# Patient Record
Sex: Female | Born: 1960 | Race: White | Hispanic: No | Smoking: Former smoker
Health system: Southern US, Community
[De-identification: ages and names within clinical notes are randomized; demographics above are authoritative.]

## PROBLEM LIST (undated history)

## (undated) DIAGNOSIS — K635 Polyp of colon: Secondary | ICD-10-CM

## (undated) DIAGNOSIS — E611 Iron deficiency: Secondary | ICD-10-CM

## (undated) DIAGNOSIS — F329 Major depressive disorder, single episode, unspecified: Secondary | ICD-10-CM

## (undated) DIAGNOSIS — R161 Splenomegaly, not elsewhere classified: Secondary | ICD-10-CM

## (undated) DIAGNOSIS — I639 Cerebral infarction, unspecified: Secondary | ICD-10-CM

## (undated) DIAGNOSIS — E119 Type 2 diabetes mellitus without complications: Secondary | ICD-10-CM

## (undated) DIAGNOSIS — F419 Anxiety disorder, unspecified: Secondary | ICD-10-CM

## (undated) DIAGNOSIS — H548 Legal blindness, as defined in USA: Secondary | ICD-10-CM

## (undated) DIAGNOSIS — F32A Depression, unspecified: Secondary | ICD-10-CM

## (undated) HISTORY — PX: TUBAL LIGATION: SHX77

## (undated) HISTORY — PX: OTHER SURGICAL HISTORY: SHX169

## (undated) HISTORY — PX: COLONOSCOPY: SHX174

---

## 2017-01-26 DIAGNOSIS — K51919 Ulcerative colitis, unspecified with unspecified complications: Secondary | ICD-10-CM | POA: Insufficient documentation

## 2017-07-17 ENCOUNTER — Other Ambulatory Visit: Payer: Self-pay | Admitting: Internal Medicine

## 2017-07-17 DIAGNOSIS — Z1231 Encounter for screening mammogram for malignant neoplasm of breast: Secondary | ICD-10-CM

## 2017-08-22 ENCOUNTER — Other Ambulatory Visit: Payer: Self-pay | Admitting: Internal Medicine

## 2017-08-22 ENCOUNTER — Encounter: Payer: Self-pay | Admitting: Radiology

## 2017-08-22 ENCOUNTER — Ambulatory Visit
Admission: RE | Admit: 2017-08-22 | Discharge: 2017-08-22 | Disposition: A | Discharge status: Home / Self Care | Payer: Medicare Other | Source: Ambulatory Visit | Attending: Internal Medicine | Admitting: Internal Medicine

## 2017-08-22 DIAGNOSIS — Z1231 Encounter for screening mammogram for malignant neoplasm of breast: Secondary | ICD-10-CM

## 2017-08-24 ENCOUNTER — Other Ambulatory Visit: Payer: Self-pay | Admitting: *Deleted

## 2017-08-24 ENCOUNTER — Inpatient Hospital Stay
Admission: RE | Admit: 2017-08-24 | Discharge: 2017-08-24 | Disposition: A | Discharge status: Home / Self Care | Payer: Self-pay | Source: Ambulatory Visit | Attending: *Deleted | Admitting: *Deleted

## 2017-08-24 DIAGNOSIS — Z9289 Personal history of other medical treatment: Secondary | ICD-10-CM

## 2017-12-14 DIAGNOSIS — K51919 Ulcerative colitis, unspecified with unspecified complications: Secondary | ICD-10-CM | POA: Diagnosis not present

## 2017-12-14 DIAGNOSIS — Z0000 Encounter for general adult medical examination without abnormal findings: Secondary | ICD-10-CM | POA: Diagnosis not present

## 2017-12-14 DIAGNOSIS — R779 Abnormality of plasma protein, unspecified: Secondary | ICD-10-CM | POA: Diagnosis not present

## 2017-12-14 DIAGNOSIS — H3552 Pigmentary retinal dystrophy: Secondary | ICD-10-CM | POA: Diagnosis not present

## 2017-12-14 DIAGNOSIS — M25512 Pain in left shoulder: Secondary | ICD-10-CM | POA: Diagnosis not present

## 2017-12-14 DIAGNOSIS — Z1211 Encounter for screening for malignant neoplasm of colon: Secondary | ICD-10-CM | POA: Diagnosis not present

## 2017-12-14 DIAGNOSIS — F339 Major depressive disorder, recurrent, unspecified: Secondary | ICD-10-CM | POA: Diagnosis not present

## 2017-12-14 DIAGNOSIS — E1140 Type 2 diabetes mellitus with diabetic neuropathy, unspecified: Secondary | ICD-10-CM | POA: Diagnosis not present

## 2017-12-14 DIAGNOSIS — Z9181 History of falling: Secondary | ICD-10-CM | POA: Diagnosis not present

## 2018-01-23 ENCOUNTER — Other Ambulatory Visit: Payer: Self-pay

## 2018-01-23 ENCOUNTER — Encounter: Payer: Self-pay | Admitting: *Deleted

## 2018-01-23 ENCOUNTER — Emergency Department
Admission: EM | Admit: 2018-01-23 | Discharge: 2018-01-23 | Disposition: A | Discharge status: Home / Self Care | Payer: PPO | Attending: Emergency Medicine | Admitting: Emergency Medicine

## 2018-01-23 DIAGNOSIS — S70311A Abrasion, right thigh, initial encounter: Secondary | ICD-10-CM | POA: Diagnosis not present

## 2018-01-23 DIAGNOSIS — E119 Type 2 diabetes mellitus without complications: Secondary | ICD-10-CM | POA: Diagnosis not present

## 2018-01-23 DIAGNOSIS — S60511A Abrasion of right hand, initial encounter: Secondary | ICD-10-CM | POA: Diagnosis not present

## 2018-01-23 DIAGNOSIS — S6991XA Unspecified injury of right wrist, hand and finger(s), initial encounter: Secondary | ICD-10-CM | POA: Diagnosis present

## 2018-01-23 DIAGNOSIS — Y939 Activity, unspecified: Secondary | ICD-10-CM | POA: Diagnosis not present

## 2018-01-23 DIAGNOSIS — Y929 Unspecified place or not applicable: Secondary | ICD-10-CM | POA: Insufficient documentation

## 2018-01-23 DIAGNOSIS — Y999 Unspecified external cause status: Secondary | ICD-10-CM | POA: Diagnosis not present

## 2018-01-23 DIAGNOSIS — T7411XA Adult physical abuse, confirmed, initial encounter: Secondary | ICD-10-CM | POA: Diagnosis not present

## 2018-01-23 DIAGNOSIS — W503XXA Accidental bite by another person, initial encounter: Secondary | ICD-10-CM | POA: Insufficient documentation

## 2018-01-23 DIAGNOSIS — Z23 Encounter for immunization: Secondary | ICD-10-CM | POA: Diagnosis not present

## 2018-01-23 DIAGNOSIS — S7011XA Contusion of right thigh, initial encounter: Secondary | ICD-10-CM | POA: Diagnosis not present

## 2018-01-23 DIAGNOSIS — S61451A Open bite of right hand, initial encounter: Secondary | ICD-10-CM | POA: Diagnosis not present

## 2018-01-23 HISTORY — DX: Type 2 diabetes mellitus without complications: E11.9

## 2018-01-23 HISTORY — DX: Major depressive disorder, single episode, unspecified: F32.9

## 2018-01-23 HISTORY — DX: Depression, unspecified: F32.A

## 2018-01-23 MED ORDER — AMOXICILLIN-POT CLAVULANATE 875-125 MG PO TABS: 1.0000 | ORAL_TABLET | Freq: Two times a day (BID) | ORAL | 0 refills | Status: DC

## 2018-01-23 MED ORDER — TETANUS-DIPHTH-ACELL PERTUSSIS 5-2.5-18.5 LF-MCG/0.5 IM SUSP
0.5000 mL | Freq: Once | INTRAMUSCULAR | Status: AC
  Administered 2018-01-23: 0.5 mL via INTRAMUSCULAR
  Filled 2018-01-23: qty 0.5
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
## 2018-01-31 DIAGNOSIS — K5190 Ulcerative colitis, unspecified, without complications: Secondary | ICD-10-CM | POA: Diagnosis not present

## 2018-01-31 DIAGNOSIS — Z86010 Personal history of colonic polyps: Secondary | ICD-10-CM | POA: Diagnosis not present

## 2018-01-31 DIAGNOSIS — Z8371 Family history of colonic polyps: Secondary | ICD-10-CM | POA: Diagnosis not present

## 2018-04-16 ENCOUNTER — Ambulatory Visit: Payer: PPO | Admitting: Anesthesiology

## 2018-04-16 ENCOUNTER — Encounter: Payer: Self-pay | Admitting: Anesthesiology

## 2018-04-16 ENCOUNTER — Ambulatory Visit
Admission: RE | Admit: 2018-04-16 | Discharge: 2018-04-16 | Disposition: A | Discharge status: Home / Self Care | Payer: PPO | Source: Ambulatory Visit | Attending: Unknown Physician Specialty | Admitting: Unknown Physician Specialty

## 2018-04-16 ENCOUNTER — Encounter
Admission: RE | Disposition: A | Discharge status: Home / Self Care | Payer: Self-pay | Source: Ambulatory Visit | Attending: Unknown Physician Specialty

## 2018-04-16 DIAGNOSIS — K5190 Ulcerative colitis, unspecified, without complications: Secondary | ICD-10-CM | POA: Diagnosis not present

## 2018-04-16 DIAGNOSIS — Z8371 Family history of colonic polyps: Secondary | ICD-10-CM | POA: Diagnosis not present

## 2018-04-16 DIAGNOSIS — Z79899 Other long term (current) drug therapy: Secondary | ICD-10-CM | POA: Insufficient documentation

## 2018-04-16 DIAGNOSIS — K640 First degree hemorrhoids: Secondary | ICD-10-CM | POA: Insufficient documentation

## 2018-04-16 DIAGNOSIS — E119 Type 2 diabetes mellitus without complications: Secondary | ICD-10-CM | POA: Diagnosis not present

## 2018-04-16 DIAGNOSIS — F419 Anxiety disorder, unspecified: Secondary | ICD-10-CM | POA: Insufficient documentation

## 2018-04-16 DIAGNOSIS — Z87891 Personal history of nicotine dependence: Secondary | ICD-10-CM | POA: Insufficient documentation

## 2018-04-16 DIAGNOSIS — F418 Other specified anxiety disorders: Secondary | ICD-10-CM | POA: Diagnosis not present

## 2018-04-16 DIAGNOSIS — R600 Localized edema: Secondary | ICD-10-CM | POA: Diagnosis not present

## 2018-04-16 DIAGNOSIS — K648 Other hemorrhoids: Secondary | ICD-10-CM | POA: Diagnosis not present

## 2018-04-16 DIAGNOSIS — Z86010 Personal history of colonic polyps: Secondary | ICD-10-CM | POA: Diagnosis not present

## 2018-04-16 DIAGNOSIS — F329 Major depressive disorder, single episode, unspecified: Secondary | ICD-10-CM | POA: Diagnosis not present

## 2018-04-16 DIAGNOSIS — Z7984 Long term (current) use of oral hypoglycemic drugs: Secondary | ICD-10-CM | POA: Diagnosis not present

## 2018-04-16 DIAGNOSIS — K529 Noninfective gastroenteritis and colitis, unspecified: Secondary | ICD-10-CM | POA: Diagnosis present

## 2018-04-16 HISTORY — DX: Polyp of colon: K63.5

## 2018-04-16 HISTORY — DX: Anxiety disorder, unspecified: F41.9

## 2018-04-16 HISTORY — DX: Legal blindness, as defined in USA: H54.8

## 2018-04-16 HISTORY — PX: COLONOSCOPY WITH PROPOFOL: SHX5780

## 2018-04-16 LAB — GLUCOSE, CAPILLARY
GLUCOSE-CAPILLARY: 279 mg/dL — AB (ref 70–99)
Glucose-Capillary: 333 mg/dL — ABNORMAL HIGH (ref 70–99)

## 2018-04-16 SURGERY — COLONOSCOPY WITH PROPOFOL
Anesthesia: General

## 2018-04-16 MED ORDER — INSULIN ASPART 100 UNIT/ML ~~LOC~~ SOLN
6.0000 [IU] | Freq: Once | SUBCUTANEOUS | Status: AC
  Administered 2018-04-16: 6 [IU] via SUBCUTANEOUS

## 2018-04-16 MED ORDER — INSULIN ASPART 100 UNIT/ML ~~LOC~~ SOLN
SUBCUTANEOUS | Status: AC
  Filled 2018-04-16: qty 1

## 2018-04-16 MED ORDER — FENTANYL CITRATE (PF) 100 MCG/2ML IJ SOLN: 25.0000 ug | INTRAMUSCULAR | Status: DC | PRN

## 2018-04-16 MED ORDER — PROPOFOL 500 MG/50ML IV EMUL
INTRAVENOUS | Status: DC | PRN
  Administered 2018-04-16: 120 ug/kg/min via INTRAVENOUS

## 2018-04-16 MED ORDER — PROPOFOL 10 MG/ML IV BOLUS
INTRAVENOUS | Status: DC | PRN
  Administered 2018-04-16: 100 mg via INTRAVENOUS

## 2018-04-16 MED ORDER — SODIUM CHLORIDE 0.9 % IV SOLN: INTRAVENOUS | Status: DC

## 2018-04-16 MED ORDER — PROPOFOL 500 MG/50ML IV EMUL
INTRAVENOUS | Status: AC
Start: 2018-04-16 — End: ?
  Filled 2018-04-16: qty 50

## 2018-04-16 MED ORDER — SODIUM CHLORIDE 0.9 % IV SOLN
INTRAVENOUS | Status: DC
  Administered 2018-04-16: 1000 mL via INTRAVENOUS

## 2018-04-16 MED ORDER — ONDANSETRON HCL 4 MG/2ML IJ SOLN: 4.0000 mg | Freq: Once | INTRAMUSCULAR | Status: DC | PRN

## 2018-04-16 NOTE — H&P (Signed)
Primary Care Physician:  Tracie Harrier, MD Primary Gastroenterologist:  Dr. Vira Agar  Pre-Procedure History & Physical: HPI:  Lydia Foster is a 57 y.o. female is here for an colonoscopy.  Done for history of ulcerative colitis.   Past Medical History:  Diagnosis Date  . Anxiety   . Colon polyps   . Depression   . Diabetes mellitus without complication ([place])   . Legally blind     Past Surgical History:  Procedure Laterality Date  . carpel tunnel    . COLONOSCOPY    . TUBAL LIGATION      Prior to Admission medications   Medication Sig Start Date End Date Taking? Authorizing Provider  Diclofenac Potassium (CAMBIA) 50 MG PACK Take by mouth as needed.   Yes [provider]  fluticasone (FLONASE) 50 MCG/ACT nasal spray Place 2 sprays into both nostrils daily.   Yes [provider]  gabapentin (NEURONTIN) 300 MG capsule Take 300 mg by mouth 3 (three) times daily.   Yes [provider]  glipiZIDE (GLUCOTROL XL) 10 MG 24 hr tablet Take 10 mg by mouth daily with breakfast.   Yes [provider]  glipiZIDE (GLUCOTROL) 10 MG tablet Take 10 mg by mouth daily before breakfast.   Yes [provider]  lisinopril (PRINIVIL,ZESTRIL) 10 MG tablet Take 10 mg by mouth daily.   Yes [provider]  LORazepam (ATIVAN) 0.5 MG tablet Take 0.5 mg by mouth every 8 (eight) hours as needed for anxiety.   Yes [provider]  meloxicam (MOBIC) 7.5 MG tablet Take 7.5 mg by mouth daily.   Yes [provider]  mesalamine (LIALDA) 1.2 g EC tablet Take by mouth daily with breakfast.   Yes [provider]  metFORMIN (GLUCOPHAGE) 1000 MG tablet Take 1,000 mg by mouth 2 (two) times daily with a meal.   Yes [provider]  Multiple Vitamin (MULTIVITAMIN) tablet Take 1 tablet by mouth daily.   Yes [provider]  mupirocin ointment (BACTROBAN) 2 % Place 1 application into the nose 3 (three) times daily.   Yes  [provider]  nystatin cream (MYCOSTATIN) Apply 1 application topically 2 (two) times daily.   Yes [provider]  sitaGLIPtin (JANUVIA) 100 MG tablet Take 100 mg by mouth daily.   Yes [provider]  venlafaxine XR (EFFEXOR-XR) 150 MG 24 hr capsule Take 150 mg by mouth daily with breakfast.   Yes [provider]  vitamin C (ASCORBIC ACID) 500 MG tablet Take 500 mg by mouth daily.   Yes [provider]  vitamin E 400 UNIT capsule Take 400 Units by mouth daily.   Yes [provider]  amoxicillin-clavulanate (AUGMENTIN) 875-125 MG tablet Take 1 tablet by mouth 2 (two) times daily. Patient not taking: Reported on 04/16/2018 01/23/18   Cuthriell, Charline Bills, PA-C    Allergies as of 02/21/2018 - Review Complete 01/23/2018  Allergen Reaction Noted  . Codeine Other (See Comments) 01/23/2018  . Hydrocodone-acetaminophen Other (See Comments) 01/23/2018  . Sulfa antibiotics Other (See Comments) 01/23/2018  . Tioconazole Other (See Comments) 11/29/2016  . Ibuprofen Rash 03/22/2016    Family History  Problem Relation Age of Onset  . Breast cancer Maternal Aunt     Social History   Socioeconomic History  . Marital status: Legally Separated    Spouse name: Not on file  . Number of children: Not on file  . Years of education: Not on file  . Highest education level:  Not on file  Occupational History  . Not on file  Social Needs  . Financial resource strain: Not on file  . Food insecurity:    Worry: Not on file    Inability: Not on file  . Transportation needs:    Medical: Not on file    Non-medical: Not on file  Tobacco Use  . Smoking status: Former Research scientist (life sciences)  . Smokeless tobacco: Never Used  Substance and Sexual Activity  . Alcohol use: Never    Frequency: Never  . Drug use: Never  . Sexual activity: Not on file  Lifestyle  . Physical activity:    Days per week: Not on file    Minutes per session: Not on file  . Stress: Not  on file  Relationships  . Social connections:    Talks on phone: Not on file    Gets together: Not on file    Attends religious service: Not on file    Active member of club or organization: Not on file    Attends meetings of clubs or organizations: Not on file    Relationship status: Not on file  . Intimate partner violence:    Fear of current or ex partner: Not on file    Emotionally abused: Not on file    Physically abused: Not on file    Forced sexual activity: Not on file  Other Topics Concern  . Not on file  Social History Narrative  . Not on file    Review of Systems: See HPI, otherwise negative ROS  Physical Exam: BP 114/83   Pulse 86   Temp (!) 96.3 F (35.7 C) (Tympanic)   Resp 20   Ht 5' 4"  (1.626 m)   Wt 81.6 kg (180 lb)   SpO2 98%   BMI 30.90 kg/m  General:   Alert,  pleasant and cooperative in NAD Head:  Normocephalic and atraumatic. Neck:  Supple; no masses or thyromegaly. Lungs:  Clear throughout to auscultation.    Heart:  Regular rate and rhythm. Abdomen:  Soft, nontender and nondistended. Normal bowel sounds, without guarding, and without rebound.   Neurologic:  Alert and  oriented x4;  grossly normal neurologically.  Impression/Plan: Lydia Foster is here for an colonoscopy to be performed for follow up ulcerative colitis  Risks, benefits, limitations, and alternatives regarding  colonoscopy have been reviewed with the patient.  Questions have been answered.  All parties agreeable.   Gaylyn Cheers, MD  04/16/2018, 10:06 AM

## 2018-04-16 NOTE — Anesthesia Preprocedure Evaluation (Signed)
Anesthesia Evaluation  Patient identified by MRN, date of birth, ID band Patient awake    Reviewed: Allergy & Precautions, NPO status , Patient's Chart, lab work & pertinent test results  Airway Mallampati: III  TM Distance: <3 FB     Dental  (+) Chipped   Pulmonary former smoker,    Pulmonary exam normal        Cardiovascular negative cardio ROS Normal cardiovascular exam     Neuro/Psych PSYCHIATRIC DISORDERS Anxiety Depression    GI/Hepatic Neg liver ROS, PUD,   Endo/Other  diabetes, Type 2, Oral Hypoglycemic Agents  Renal/GU negative Renal ROS  negative genitourinary   Musculoskeletal   Abdominal Normal abdominal exam  (+)   Peds negative pediatric ROS (+)  Hematology negative hematology ROS (+)   Anesthesia Other Findings Past Medical History: No date: Anxiety No date: Colon polyps No date: Depression No date: Diabetes mellitus without complication (HCC) No date: Legally blind  Reproductive/Obstetrics                             Anesthesia Physical Anesthesia Plan  ASA: III  Anesthesia Plan: General   Post-op Pain Management:    Induction:   PONV Risk Score and Plan: TIVA  Airway Management Planned: Nasal Cannula  Additional Equipment:   Intra-op Plan:   Post-operative Plan:   Informed Consent: I have reviewed the patients History and Physical, chart, labs and discussed the procedure including the risks, benefits and alternatives for the proposed anesthesia with the patient or authorized representative who has indicated his/her understanding and acceptance.   Dental advisory given  Plan Discussed with: CRNA  Anesthesia Plan Comments:         Anesthesia Quick Evaluation

## 2018-04-16 NOTE — Anesthesia Post-op Follow-up Note (Signed)
Anesthesia QCDR form completed.        

## 2018-04-16 NOTE — Op Note (Signed)
[facility] Patient Name: Lydia Foster Procedure Date: 04/16/2018 10:21 AM MRN: [id number] Account #: [id number] Date of Birth: [birth year] Admit Type: Outpatient Age: 57 Room: [facility] ENDO ROOM 1 Gender: Female Note Status: Finalized Procedure:            Colonoscopy Indications:          Follow-up of ulcerative colitis Providers:            Manya Silvas, MD Referring MD:         Tracie Harrier, MD (Referring MD) Medicines:            Propofol per Anesthesia Complications:        No immediate complications. Procedure:            Pre-Anesthesia Assessment:                       - After reviewing the risks and benefits, the patient                        was deemed in satisfactory condition to undergo the                        procedure.                       After obtaining informed consent, the colonoscope was                        passed under direct vision. Throughout the procedure,                        the patient's blood pressure, pulse, and oxygen                        saturations were monitored continuously. The                        Colonoscope was introduced through the anus and                        advanced to the the cecum, identified by appendiceal                        orifice and ileocecal valve. The colonoscopy was                        performed without difficulty. The patient tolerated the                        procedure well. The quality of the bowel preparation                        was good. Findings:      Internal hemorrhoids were found during endoscopy. The hemorrhoids were       small and Grade I (internal hemorrhoids that do not prolapse).      A tattoo was seen in the descending colon. This is from a previous       colonoscopy when a polyp was removed.      The entire colon was examined and the mucosa looked good.      To examine the  colon we biopsied the ascending, transverse, descending       and  sigmoid colon. Impression:           - Internal hemorrhoids.                       - A tattoo was seen in the descending colon.                       - No specimens collected. Recommendation:       - Await pathology results. Manya Silvas, MD 04/16/2018 10:50:07 AM This report has been signed electronically. Number of Addenda: 0 Note Initiated On: 04/16/2018 10:21 AM Scope Withdrawal Time: 0 hours 8 minutes 11 seconds  Total Procedure Duration: 0 hours 16 minutes 31 seconds       [facility]

## 2018-04-16 NOTE — Anesthesia Postprocedure Evaluation (Signed)
Anesthesia Post Note  Patient: Lydia Foster  Procedure(s) Performed: COLONOSCOPY WITH PROPOFOL (N/A )  Patient location during evaluation: PACU Anesthesia Type: General Level of consciousness: awake and awake and alert Pain management: pain level controlled Vital Signs Assessment: post-procedure vital signs reviewed and stable Respiratory status: spontaneous breathing and patient connected to nasal cannula oxygen Cardiovascular status: blood pressure returned to baseline Postop Assessment: no apparent nausea or vomiting Anesthetic complications: no     Last Vitals:  Vitals:   04/16/18 0944  BP: 114/83  Pulse: 86  Resp: 20  Temp: (!) 35.7 C  SpO2: 98%    Last Pain:  Vitals:   04/16/18 0944  TempSrc: Tympanic  PainSc: 0-No pain                 Lydia Foster

## 2018-04-16 NOTE — Transfer of Care (Signed)
Immediate Anesthesia Transfer of Care Note  Patient: Lydia Foster  Procedure(s) Performed: COLONOSCOPY WITH PROPOFOL (N/A )  Patient Location: PACU  Anesthesia Type:General  Level of Consciousness: awake, alert  and oriented  Airway & Oxygen Therapy: Patient Spontanous Breathing and Patient connected to nasal cannula oxygen  Post-op Assessment: Report given to RN and Post -op Vital signs reviewed and stable  Post vital signs: Reviewed and stable  Last Vitals:  Vitals Value Taken Time  BP 70/49 04/16/2018 10:47 AM  Temp    Pulse 78 04/16/2018 10:48 AM  Resp 22 04/16/2018 10:48 AM  SpO2 100 % 04/16/2018 10:48 AM  Vitals shown include unvalidated device data.  Last Pain:  Vitals:   04/16/18 0944  TempSrc: Tympanic  PainSc: 0-No pain         Complications: No apparent anesthesia complications

## 2018-04-17 ENCOUNTER — Encounter: Payer: Self-pay | Admitting: Unknown Physician Specialty

## 2018-04-17 LAB — SURGICAL PATHOLOGY

## 2018-04-20 DIAGNOSIS — Z1211 Encounter for screening for malignant neoplasm of colon: Secondary | ICD-10-CM | POA: Diagnosis not present

## 2018-04-20 DIAGNOSIS — H3552 Pigmentary retinal dystrophy: Secondary | ICD-10-CM | POA: Diagnosis not present

## 2018-04-20 DIAGNOSIS — Z9181 History of falling: Secondary | ICD-10-CM | POA: Diagnosis not present

## 2018-04-20 DIAGNOSIS — Z0000 Encounter for general adult medical examination without abnormal findings: Secondary | ICD-10-CM | POA: Diagnosis not present

## 2018-04-20 DIAGNOSIS — M25512 Pain in left shoulder: Secondary | ICD-10-CM | POA: Diagnosis not present

## 2018-04-20 DIAGNOSIS — F339 Major depressive disorder, recurrent, unspecified: Secondary | ICD-10-CM | POA: Diagnosis not present

## 2018-04-20 DIAGNOSIS — E1140 Type 2 diabetes mellitus with diabetic neuropathy, unspecified: Secondary | ICD-10-CM | POA: Diagnosis not present

## 2018-04-20 DIAGNOSIS — K51919 Ulcerative colitis, unspecified with unspecified complications: Secondary | ICD-10-CM | POA: Diagnosis not present

## 2018-04-27 DIAGNOSIS — R300 Dysuria: Secondary | ICD-10-CM | POA: Diagnosis not present

## 2018-04-27 DIAGNOSIS — E1140 Type 2 diabetes mellitus with diabetic neuropathy, unspecified: Secondary | ICD-10-CM | POA: Diagnosis not present

## 2018-04-27 DIAGNOSIS — K51919 Ulcerative colitis, unspecified with unspecified complications: Secondary | ICD-10-CM | POA: Diagnosis not present

## 2018-04-27 DIAGNOSIS — H3552 Pigmentary retinal dystrophy: Secondary | ICD-10-CM | POA: Diagnosis not present

## 2018-05-08 ENCOUNTER — Other Ambulatory Visit: Payer: Self-pay | Admitting: *Deleted

## 2018-05-08 DIAGNOSIS — E1165 Type 2 diabetes mellitus with hyperglycemia: Secondary | ICD-10-CM

## 2018-05-08 DIAGNOSIS — F329 Major depressive disorder, single episode, unspecified: Secondary | ICD-10-CM

## 2018-05-08 DIAGNOSIS — F32A Depression, unspecified: Secondary | ICD-10-CM

## 2018-05-08 NOTE — Patient Outreach (Addendum)
[facility] Care Management  05/08/2018  Marcene Brawneresa Hartland Mar 08, 1961 [id number]   Successful initial telephone outreach to member to complete [employer] health risk assessment screening. Mrs Lydia Foster states she has struggled with glucose issues since she was a teenager but has generally been in good control until recently. She states she recently spent much time at the hospital with her nephew who has a seizure disorder and she was under significant stress and was not following a healthy meal plan. She says she checks her blood sugar three times daily, drinks only water or vitamin water and limits her carbohydrate intake. She says she was recently started on a weekly injection for her diabetes but could not recall the name.  She says she was also told recently that she has an underactive thyroid. She says she does not see an endocrinologist. She states her A1C was >10.0 but her electronic medical record indicates her most recent Hgb A1C was 7.5% on 12/14/17.  She relates that she has urinary incontinence that started after she had her children. She says she does not want to have surgery or take medication but would be willing to consider other treatment options.  Her PHQ- 2 score was 1 and she is on antidepressant, Effexor-XR. When asked, she indicated she would be interested in speaking with a LCSW from [facility] Care Management to discuss her depression issues.  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  With member's permission, will make referral to LCSW and health coach. Will mail successful outreach letter in addition to a  [facility] information brochure , 24 hour nurse advice line magnet and information on percutaneous tibial nerve  stimulation as a possible treatment option for urinary incontinence.   Bary RichardJanet S. Ambrie Carte RN,CCM,CDE [facility] Care Management Coordinator Office Phone [phone] Office Fax [phone]

## 2018-05-09 ENCOUNTER — Encounter: Payer: Self-pay | Admitting: *Deleted

## 2018-05-10 ENCOUNTER — Other Ambulatory Visit: Payer: Self-pay

## 2018-05-10 NOTE — Patient Outreach (Signed)
[facility] Care Management  05/10/2018  Marcene Brawneresa Bumgarner Dec 14, 1960 [id number]  Successful outreach to the patient on today's date, HIPAA identifiers confirmed. BSW introduced self to the patient and the reason for today's call. BSW discussed the patients recent referral to this BSW for assistance with transportation to and from physician appointments. The patient currently utilizes [employer] [employer] [place] [employer] [phone]([employer]). Unfortunately, the patient stated she must provide [employer] a 30 day notice prior to being services. The patient further reports she is only able to receive transportation via [employer] on Tuesdays and Thursdays.   The patient declines Medicaid eligibility. The patient does not live within city limits. The patient understands she is limited to transportation resources given demographic location. BSW will review available resources to the patient and contact her within the next two weeks.   Bevelyn NgoKendra Uzoma Vivona, BSW, CDP [facility] Care [facility] Social Worker [phone]

## 2018-05-15 ENCOUNTER — Other Ambulatory Visit: Payer: Self-pay

## 2018-05-15 ENCOUNTER — Encounter: Payer: Self-pay | Admitting: *Deleted

## 2018-05-15 ENCOUNTER — Other Ambulatory Visit: Payer: Self-pay | Admitting: *Deleted

## 2018-05-15 NOTE — Patient Outreach (Addendum)
[facility]) Care Management  05/15/2018  Shayann Garbutt 02/20/1961 [id number]   Patient referred to this patient by [facility] RN Kelli Churn to address patient's depression. Per patient she is the primary caregiver for her mother and her 59 year old nephew with special needs. Per patient, she has very little help or support from her family members that live locally.  Patient states that she resides in [place] and came to [place] to provide care for her mother and nephew.  Patient's daughter and grandchildren reside in [place] and would visit regularly until patient's mother complained. Patient's nephew is normally is in school 6 hours a day and has an aid after school as well as respite care hours on the weekends. Patient's nephew was recently in the hospital and should resume his regular schedule  in the next couple of weeks which may allow for a small break. Availability for breaks are described as a bit more difficult with patient's mother as she often refuses to leave the home. Per patient, her mother is not interested in ALF, or involving herself in activities at the [facility]. Per patient, there is very little empathy shown by her family regarding  her care giving  duties.  This Education officer, museum emphasized the importance of self care, healthy boundaries and coping strategies for her stress. Patient denies thoughts of self harm stating that she would never do that to herself.  Discussed following up with the psychiatrist she had seen in the past. This social worker discussed contacting her insurance company for a list of in network mental health providers. Also discussed contacting [place] to discuss available in home respite hours  as well as caregiver support classes. [phone] This social worker's contact number provided as well. Patient verbalized  very little confidence that her family situation could change.    Plan: This social worker will call patient back within 2  weeks to assess for social work involvement.    [place], [place] Management [place], [place] Care Management [phone]

## 2018-05-15 NOTE — Patient Outreach (Signed)
[facility]) Care Management  05/15/2018  Lydia Foster [birth year] [id number]  BSW spoke with the patient on today's date, HIPAA identifiers confirmed. BSW informed the patient that unfortunately, no other transportation resources have been identified other than [employer] which the patient already utilizes. The patient stated understanding. The patient denies any upcomming appointments which she needs transportation assistance with.   BSW to remove self from the patients care team as no other community resource needs have been identified. The patient is aware she will be outreach by CSW, [employer] [place] for assistance with mental health needs.  Bevelyn NgoKendra Ambika Zettlemoyer, BSW, CDP [facility] Care Management Social Worker [phone]

## 2018-05-15 NOTE — Patient Outreach (Signed)
[facility] ([facility]) Care Management  05/15/2018   Lydia Foster 09-Jul-1961 [id number]  [facility] to patient.  Hipaa compliance verified. Per patient she had not checked her blood sugar. 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 Per patient she does not have a routine exercise program because she is walking around the house back and forth. Per patient she is legally blind but can read large print close up. Patient stated she is independent but mother doesn't want her to leave the house to go out much. Patient was teary at times when talking about her mother and nephew and how stressed she is. Per patient she does not drive.   Patient has agreed to follow up outreach calls.    Current Medications:  Current Outpatient Medications  Medication Sig Dispense Refill  . amoxicillin-clavulanate (AUGMENTIN) 875-125 MG tablet Take 1 tablet by mouth 2 (two) times daily. (Patient not taking: Reported on 04/16/2018) 14 tablet 0  . Diclofenac Potassium (CAMBIA) 50 MG PACK Take by mouth as needed.    . fluticasone (FLONASE) 50 MCG/ACT nasal spray Place 2 sprays into both nostrils daily.    Marland Kitchen. gabapentin (NEURONTIN) 300 MG capsule Take 300 mg by mouth 3 (three) times daily.    Marland Kitchen. glipiZIDE (GLUCOTROL XL) 10 MG 24 hr tablet Take 10 mg by mouth daily with breakfast.    . glipiZIDE (GLUCOTROL) 10 MG tablet Take 10 mg by mouth daily before breakfast.    . lisinopril (PRINIVIL,ZESTRIL) 10 MG tablet Take 10 mg by mouth daily.    Marland Kitchen. LORazepam (ATIVAN) 0.5 MG tablet Take 0.5 mg by mouth every 8 (eight) hours as needed for anxiety.    . meloxicam (MOBIC) 7.5 MG tablet Take 7.5 mg by mouth daily.    . mesalamine (LIALDA) 1.2 g EC tablet Take by mouth daily with breakfast.    . metFORMIN (GLUCOPHAGE) 1000 MG tablet Take 1,000 mg by mouth 2 (two) times daily with a meal.    . Multiple Vitamin (MULTIVITAMIN) tablet Take 1 tablet by  mouth daily.    . mupirocin ointment (BACTROBAN) 2 % Place 1 application into the nose 3 (three) times daily.    Marland Kitchen. nystatin cream (MYCOSTATIN) Apply 1 application topically 2 (two) times daily.    . sitaGLIPtin (JANUVIA) 100 MG tablet Take 100 mg by mouth daily.    Marland Kitchen. venlafaxine XR (EFFEXOR-XR) 150 MG 24 hr capsule Take 150 mg by mouth daily with breakfast.    . vitamin C (ASCORBIC ACID) 500 MG tablet Take 500 mg by mouth daily.    . vitamin E 400 UNIT capsule Take 400 Units by mouth daily.     No current facility-administered medications for this visit.     Functional Status:  In your present state of health, do you have any difficulty performing the following activities: 05/15/2018  Hearing? N  Vision? Y  Comment legally blind  Difficulty concentrating or making decisions? N  Walking or climbing stairs? Y  Dressing or bathing? N  Doing errands, shopping? Y  Preparing Food and eating ? N  Using the Toilet? N  In the past six months, have you accidently leaked urine? N  Do you have problems with loss of bowel control? N  Managing your Medications? N  Managing your Finances? N  Housekeeping or managing your Housekeeping? N    Fall/Depression Screening: Fall Risk  05/15/2018  Falls in the past  year? Yes  Number falls in past yr: 1  Injury with Fall? No  Risk for fall due to : Impaired vision  Follow up Falls evaluation completed;Education provided   PHQ 2/9 Scores 05/15/2018  PHQ - 2 Score 1   [facility] CM Care Plan Problem One     Most Recent Value  Care Plan Problem One  Knowledge Deficit in Self Management of Diabetes  Role Documenting the Problem One  Health Coach  Care Plan for Problem One  Active  [facility] Long Term Goal   Patient will verbalize  decrease in A1C from 10.3 with next blood fraw  [facility] Long Term Goal Start Date  05/15/18  Interventions for Problem One Long Term Goal  RN discussed the importance of checking blood sugars. RN explianed how the blood sugars affect the  A1C. RN explained the importance of medication adherence. RN will follow up with further discussion and teachback  [facility] CM Short Term Goal #1   Patient will check blood sugars as per Dr ordered an document within the next 30 days  [facility] CM Short Term Goal #1 Start Date  05/15/18  Interventions for Short Term Goal #1  RN discussed checking blood sugars and documenting. RN dicussed taking record and meter to physician office next visit. RN will follow up with further discussion  [facility] CM Short Term Goal #2   Patient will verbalize making healthier snack choices within the next 30 days  [facility] CM Short Term Goal #2 Start Date  05/15/18  Interventions for Short Term Goal #2  RN discussed choosing healthy low carb snacks. RN sent patient a list of health snacks. RN will follow up within the next 30 days  [facility] CM Short Term Goal #3  Patient will report receiving the information on Silver and fit exercises within the next 30 days  [facility] CM Short Term Goal #3 Start Date  05/15/18  Interventions for Short Tern Goal #3  RN discussed what [facility] had to offer with Silver and Fit exercises. Patient verified having aworking DVD player. RN sent a list of the exercises for patient to choose for. RN will follow up with receiving      Assessment:  Patient does not check her blood sugars as Dr ordered Patient is not adhering to a diabetic diet Patient does not exercise A1C 10.3 Patient is caregiver for mother and nephew Patient will benefit from Health Coach telephonic outreach for education and support for diabetes self management.  Plan:  RN discussed A1C and checking blood sugars RN discussed healthy snack and dining out choices RN discussed a routine exercise program RN discussed Silver and Fit program from [facility] RN sent Dining out menus for Diabetics RN sent educational material onVery Low Carb Best BuySnacks RN sent educational material on Snacks RN sent living well with Diabetes book RN sent 2019 Calendar book RN sent  barriers letter and assessment to physician   [name] BSN RN [facility] Care Management [phone]

## 2018-05-22 ENCOUNTER — Other Ambulatory Visit: Payer: Self-pay | Admitting: *Deleted

## 2018-05-22 NOTE — Patient Outreach (Signed)
[facility]) Care Management  05/22/2018  Marcene Brawneresa Piche [birth year] [id number]   Follow up phone call to patient to follow up on her depression and anxiety related to her strained relationship with her family. Per patient, her nephew with special needs is due to return back to school next week which may help with caregiver strain.  Patient states that she has made the decision not to feed in to her mother's negativity which has helped her care giving demands. Patient has not followed up with the referrals for respite or Psychiatry but states that she does have the contact information and will call. Patient states that she  is working on her diet eating less carbohydrates but states that she does not feel that her glucose meter is working correctly. Patient states that she Is legally blind and feels that a talking meter would be best but she cannot afford it as it is not covered by her insurance. This Child psychotherapistsocial worker discussed the above with Health Coach Scarlette CalicoFrances Pleasant who suggested that this social worker confirm patient's coverage with her insurance company.  This social worker contacted [employer] Corporationpatient's insurance company and confirmed that patient would have to pay 20% of the cost of the meter with a prescription from her doctor. Average cost of a talking meter form [facility] was approximately $50.00.  Patient was appreciative of the information provided and states that she will check her pharmacy to make sure they have it in stock and the cost. She will obtain the prescription from her provider's office.  Patient reminded to include the strips and lancets. Patient verbalized having no additional social work needs at this time. Patient encouraged to contact this social worker if there are any additional community resource needs in the future. This Child psychotherapistsocial worker will sign off at this time.   Adriana ReamsChrystal Taj Nevins, LCSW [facility] Care [facility] [phone]

## 2018-06-18 ENCOUNTER — Other Ambulatory Visit: Payer: Self-pay | Admitting: *Deleted

## 2018-06-18 NOTE — Patient Outreach (Addendum)
[facility]) Care Management  06/18/2018  Lydia Foster 1961/04/18 [id number]   RN Health Coach attempted #[phone] up outreach call to patient.  Patient was unavailable. HIPPA compliance voicemail message left with return callback number.  Plan: RN will call patient again within 10 days.  Gean MaidensFrances Darcell Sabino BSN RN [facility] Care Management [phone]

## 2018-07-02 ENCOUNTER — Other Ambulatory Visit: Payer: Self-pay | Admitting: *Deleted

## 2018-07-02 NOTE — Patient Outreach (Signed)
[facility]) Care Management  07/02/2018   Lydia Foster 27-Sep-1960 [id number]  RN Health Coach telephone call to patient.  Hipaa compliance verified. Per patient her fasting blood sugar is 200.  Per patient she has not been following her diet very well. Patient stated she has been under a lot of stress. Per patient she has not been following her diet. Patient said she had gotten a new glucometer from [employer]. She thought it was a talking meter but it is not. RN called [facility] and was told they do not have a talking meter. Patient has agreed to follow up outreach calls.    Current Medications:  Current Outpatient Medications  Medication Sig Dispense Refill  . amoxicillin-clavulanate (AUGMENTIN) 875-125 MG tablet Take 1 tablet by mouth 2 (two) times daily. 14 tablet 0  . Diclofenac Potassium (CAMBIA) 50 MG PACK Take by mouth as needed.    . fluticasone (FLONASE) 50 MCG/ACT nasal spray Place 2 sprays into both nostrils daily.    Marland Kitchen gabapentin (NEURONTIN) 300 MG capsule Take 300 mg by mouth 3 (three) times daily.    Marland Kitchen glipiZIDE (GLUCOTROL XL) 10 MG 24 hr tablet Take 10 mg by mouth daily with breakfast.    . glipiZIDE (GLUCOTROL) 10 MG tablet Take 10 mg by mouth daily before breakfast.    . lisinopril (PRINIVIL,ZESTRIL) 10 MG tablet Take 10 mg by mouth daily.    Marland Kitchen LORazepam (ATIVAN) 0.5 MG tablet Take 0.5 mg by mouth every 8 (eight) hours as needed for anxiety.    . meloxicam (MOBIC) 7.5 MG tablet Take 7.5 mg by mouth daily.    . mesalamine (LIALDA) 1.2 g EC tablet Take by mouth daily with breakfast.    . metFORMIN (GLUCOPHAGE) 1000 MG tablet Take 1,000 mg by mouth 2 (two) times daily with a meal.    . Multiple Vitamin (MULTIVITAMIN) tablet Take 1 tablet by mouth daily.    . mupirocin ointment (BACTROBAN) 2 % Place 1 application into the nose 3 (three) times daily.    Marland Kitchen nystatin cream (MYCOSTATIN) Apply 1 application topically 2 (two) times daily.    . sitaGLIPtin  (JANUVIA) 100 MG tablet Take 100 mg by mouth daily.    Marland Kitchen venlafaxine XR (EFFEXOR-XR) 150 MG 24 hr capsule Take 150 mg by mouth daily with breakfast.    . vitamin C (ASCORBIC ACID) 500 MG tablet Take 500 mg by mouth daily.    . vitamin E 400 UNIT capsule Take 400 Units by mouth daily.     No current facility-administered medications for this visit.     Functional Status:  In your present state of health, do you have any difficulty performing the following activities: 05/15/2018  Hearing? N  Vision? Y  Comment legally blind  Difficulty concentrating or making decisions? N  Walking or climbing stairs? Y  Dressing or bathing? N  Doing errands, shopping? Y  Preparing Food and eating ? N  Using the Toilet? N  In the past six months, have you accidently leaked urine? Y  Do you have problems with loss of bowel control? N  Managing your Medications? N  Managing your Finances? N  Housekeeping or managing your Housekeeping? N    Fall/Depression Screening: Fall Risk  05/15/2018  Falls in the past year? Yes  Number falls in past yr: 1  Injury with Fall? No  Risk for fall due to : Impaired vision  Follow up Falls evaluation completed;Education provided   [facility] 2/9 Scores 05/15/2018  PHQ - 2 Score 1    Assessment:  Patient is not checking her blood sugars as ordered Patient is not adhering to her diet   Plan:  RN referred to pharmacy RN discussed dietary adherence RN discussed checking blood sugars as per ordered Referral to pharmacy RN will follow up within the month of November  Gean Maidens BSN RN [facility] Care Management [phone]

## 2018-07-04 ENCOUNTER — Other Ambulatory Visit: Payer: Self-pay | Admitting: Pharmacist

## 2018-07-04 NOTE — Patient Outreach (Signed)
[facility]) Care Management  07/04/2018  Tiamarie Furnari March 05, 1961 [id number]  Reason for referral:  Assistance with obtaining talking glucometer, patient legally blind  Referral source:  [facility] Health Coach Insurance:  HTA  Successful call to Ms. Marcene Brawn today. HIPAA identifiers verified. Patient reports she has used a talking glucometer for several years (does not remember the name of the brand) but that it recently broke.  She bought a glucometer from [employer] that she cannot read and does not talk.  Others have to read the CBG values to patient.  She reports that she has a glucometer prescription at [facility].  She uses [facility] for oral medications as these need to be organized in compliance packs. Patient states she tests blood sugar 3x daily.    3-way call to [facility] pharmacy.    Prescription on file for [employer], a talking glucometer.  Medicare Part B information provided for billing.   Prescription missing documentation from provider on vision diagnosis.  Pharmacy faxed provider requesting this information during call.  Patient will call Dr. Eston Esters office tomorrow to request that glucometer documents be returned as soon as possible.   Another option is [facility] brand talking glucometer, Relion, which is $19 for the meter and $18 / 100 test strips.    Plan: I will follow-up with patient and pharmacy next week for update on glucometer.   Haynes Hoehn, PharmD, [facility] Clinical Pharmacist [facility] [employer] [phone]

## 2018-07-11 ENCOUNTER — Other Ambulatory Visit: Payer: Self-pay | Admitting: Pharmacist

## 2018-07-11 NOTE — Patient Outreach (Signed)
[facility]) Care Management  [facility] 07/11/2018  Azie Mcconahy 01/10/1961 [id number]  Reason for referral: Assistance with obtaining talking glucometer, patient legally blind  Follow-up call placed to Ms. Marcene Brawn.  Patient reports that she has not been able to call Dr. Eston Esters office yet about the glucometer.  She has not heard from [facility] yet that the glucometer is ready.   [facility]-way call placed to [facility].  Staff confirmed no documents returned yet from Dr. Eston Esters office.  Information refaxed to office this morning.   3-way call placed to Dr. Eston Esters office.  Message relayed for RN regarding need for additional documentation for glucometer to be covered through patient's insurance.    Patient reports she will call daily to follow-up with Dr. Eston Esters office as needed.  I reviewed she can also call the pharmacy to see if RX ready.    Plan: I will follow-up next week with patient regarding glucometer.   Haynes Hoehn, PharmD, [facility] Clinical Pharmacist [facility] [employer] [phone]

## 2018-07-13 ENCOUNTER — Other Ambulatory Visit: Payer: Self-pay | Admitting: Pharmacist

## 2018-07-13 NOTE — Patient Outreach (Signed)
[facility]) Care Management  [facility] 07/13/2018  Lydia Foster 1961-02-25 [id number]  Reason for referral: Medication Assistance with obtaining talking glucometer, patient legally blind  Incoming call and voicemail received from Lydia Foster.  Patient reports that RN from PCP office returned her call and stated they had sent glucometer information to [facility].  Unclear if information also provided to pharmacy.   3-way call placed to [facility].  Documentation still not returned.  Documents will be re-faxed to PCP and to eye doctor.  Prices reviewed for [facility] brand Relion talking meter with strips.   Plan: F/u with patient next week  Haynes Hoehn, PharmD, [facility] Clinical Pharmacist [facility] [employer] [phone]

## 2018-07-17 ENCOUNTER — Other Ambulatory Visit: Payer: Self-pay | Admitting: Pharmacist

## 2018-07-17 NOTE — Patient Outreach (Signed)
[facility] Care Management  [facility]  07/17/2018  Faryal Marxen 1961/06/19 [id number]   Reason for referral: Medication Assistance with obtaining talking glucometer, patient legally blind.   Outreach calls:  Unsuccessful telephone call attempt #1 to patient.   Left HIPAA compliant voicemail requesting a return call.   Call placed to [facility].  Pharmacy not open yet.   Incoming return call from patient.  Per patient, glucometer has not been approved yet despite eye doctor also returning documentation forms.  Patient reports she will buy the Relion brand and can afford the monthly test strips.  She states she gets paid on the 1st of the month and will pick it up then.  No other medication related questions at this time.   Plan:  I will close [facility] case at this time.  I provided patient with my contact information if she needs to reach out to me in the future.    Thank you for allowing [facility] pharmacy to be involved in this patient's care.    Haynes Hoehn, PharmD, [facility] Clinical Pharmacist [facility] [employer] [phone]

## 2018-08-03 ENCOUNTER — Other Ambulatory Visit: Payer: Self-pay | Admitting: *Deleted

## 2018-08-03 NOTE — Patient Outreach (Signed)
[facility]) Care Management  08/03/2018  Ortencia Askari 01-15-1961 [id number]   RN Health Coach attempted #[phone] up outreach call to patient.  Patient was unavailable. HIPPA compliance voicemail message left with return callback number.  Plan: RN will call patient again within 10 business days  Gean Maidens BSN RN [facility] Care Management [phone]

## 2018-08-20 DIAGNOSIS — N950 Postmenopausal bleeding: Secondary | ICD-10-CM | POA: Diagnosis not present

## 2018-08-21 ENCOUNTER — Ambulatory Visit: Payer: Self-pay | Admitting: *Deleted

## 2018-08-28 DIAGNOSIS — Z5321 Procedure and treatment not carried out due to patient leaving prior to being seen by health care provider: Secondary | ICD-10-CM | POA: Diagnosis not present

## 2018-08-28 DIAGNOSIS — R1110 Vomiting, unspecified: Secondary | ICD-10-CM | POA: Diagnosis not present

## 2018-09-03 DIAGNOSIS — K51919 Ulcerative colitis, unspecified with unspecified complications: Secondary | ICD-10-CM | POA: Diagnosis not present

## 2018-09-03 DIAGNOSIS — H3552 Pigmentary retinal dystrophy: Secondary | ICD-10-CM | POA: Diagnosis not present

## 2018-09-03 DIAGNOSIS — R300 Dysuria: Secondary | ICD-10-CM | POA: Diagnosis not present

## 2018-09-03 DIAGNOSIS — E1140 Type 2 diabetes mellitus with diabetic neuropathy, unspecified: Secondary | ICD-10-CM | POA: Diagnosis not present

## 2018-09-10 DIAGNOSIS — K51919 Ulcerative colitis, unspecified with unspecified complications: Secondary | ICD-10-CM | POA: Diagnosis not present

## 2018-09-10 DIAGNOSIS — F339 Major depressive disorder, recurrent, unspecified: Secondary | ICD-10-CM | POA: Diagnosis not present

## 2018-09-10 DIAGNOSIS — E1140 Type 2 diabetes mellitus with diabetic neuropathy, unspecified: Secondary | ICD-10-CM | POA: Diagnosis not present

## 2018-09-10 DIAGNOSIS — M25531 Pain in right wrist: Secondary | ICD-10-CM | POA: Diagnosis not present

## 2018-09-10 DIAGNOSIS — E786 Lipoprotein deficiency: Secondary | ICD-10-CM | POA: Diagnosis not present

## 2018-09-10 DIAGNOSIS — Z8669 Personal history of other diseases of the nervous system and sense organs: Secondary | ICD-10-CM | POA: Diagnosis not present

## 2018-09-10 DIAGNOSIS — H3552 Pigmentary retinal dystrophy: Secondary | ICD-10-CM | POA: Diagnosis not present

## 2018-10-19 ENCOUNTER — Other Ambulatory Visit: Payer: Self-pay | Admitting: *Deleted

## 2018-10-19 NOTE — Patient Outreach (Signed)
[facility]) Care Management  10/19/2018  [name] November 16, 1960 [id number]   RN Health Coach attempted follow up outreach call to patient.  Patient was unavailable. HIPPA compliance voicemail message left with return callback number.  Plan: RN will call patient again within 14 days.  Gean Maidens BSN RN [facility] Care Management [phone]

## 2018-10-26 ENCOUNTER — Other Ambulatory Visit: Payer: Self-pay | Admitting: *Deleted

## 2018-10-26 NOTE — Patient Outreach (Signed)
[facility]) Care Management  10/26/2018   Lydia Foster 1961/03/09 [id number]  RN Health Coach telephone call to patient.  Hipaa compliance verified. Per patient she has started on Trulicity. Patient stated her A1C has decreased from 10.0 to 8.0. Patient is taking medications as per ordered. Patient stated her blood sugars are running in the 200. Per patient she had gotten off her diet over the holidays and she has been with her nephew the last 12 days at the hospital. Patient is not currently exercising. She stated she will start walking when her nephew gets better and they can walk together.. Per patient she has not purchased the talking meter yet. Patient stated she could afford it, but just haven't got it. Per patient she is having pain in her heel. Patient does not got to a podiatrist.RN discussed that she may need to go. Patient has agreed to further outreach calls.   Current Medications:  Current Outpatient Medications  Medication Sig Dispense Refill  . fluticasone (FLONASE) 50 MCG/ACT nasal spray Place 2 sprays into both nostrils daily.    Marland Kitchen gabapentin (NEURONTIN) 300 MG capsule Take 300 mg by mouth 3 (three) times daily.    Marland Kitchen glipiZIDE (GLUCOTROL) 10 MG tablet Take 10 mg by mouth daily before breakfast.    . lisinopril (PRINIVIL,ZESTRIL) 10 MG tablet Take 10 mg by mouth daily.    Marland Kitchen LORazepam (ATIVAN) 0.5 MG tablet Take 0.5 mg by mouth every 8 (eight) hours as needed for anxiety.    . mesalamine (LIALDA) 1.2 g EC tablet Take by mouth daily with breakfast.    . metFORMIN (GLUCOPHAGE) 1000 MG tablet Take 1,000 mg by mouth 2 (two) times daily with a meal.    . Multiple Vitamin (MULTIVITAMIN) tablet Take 1 tablet by mouth daily.    Marland Kitchen nystatin cream (MYCOSTATIN) Apply 1 application topically 2 (two) times daily.    Marland Kitchen venlafaxine XR (EFFEXOR-XR) 150 MG 24 hr capsule Take 150 mg by mouth daily with breakfast.    . vitamin C (ASCORBIC ACID) 500 MG tablet Take 500 mg by mouth  daily.    . vitamin E 400 UNIT capsule Take 400 Units by mouth daily.    Marland Kitchen amoxicillin-clavulanate (AUGMENTIN) 875-125 MG tablet Take 1 tablet by mouth 2 (two) times daily. (Patient not taking: Reported on 10/26/2018) 14 tablet 0  . Diclofenac Potassium (CAMBIA) 50 MG PACK Take by mouth as needed.    Marland Kitchen glipiZIDE (GLUCOTROL XL) 10 MG 24 hr tablet Take 10 mg by mouth daily with breakfast.    . meloxicam (MOBIC) 7.5 MG tablet Take 7.5 mg by mouth daily.    . mupirocin ointment (BACTROBAN) 2 % Place 1 application into the nose 3 (three) times daily.    . sitaGLIPtin (JANUVIA) 100 MG tablet Take 100 mg by mouth daily.     No current facility-administered medications for this visit.     Functional Status:  In your present state of health, do you have any difficulty performing the following activities: 10/26/2018 10/26/2018  Hearing? N N  Vision? Y Y  Comment - -  Difficulty concentrating or making decisions? N -  Walking or climbing stairs? Y Y  Dressing or bathing? N N  Doing errands, shopping? Lydia Foster  Preparing Food and eating ? N N  Using the Toilet? N N  In the past six months, have you accidently leaked urine? Y Y  Do you have problems with loss of bowel control? N N  Managing  your Medications? N N  Managing your Finances? N N  Housekeeping or managing your Housekeeping? N N  Some recent data might be hidden    Fall/Depression Screening: Fall Risk  10/26/2018 10/26/2018 05/15/2018  Falls in the past year? 0 0 Yes  Number falls in past yr: - - 1  Injury with Fall? - - No  Risk for fall due to : Impaired vision - Impaired vision  Follow up Falls evaluation completed - Falls evaluation completed;Education provided   [facility] 2/9 Scores 10/26/2018 10/26/2018 05/15/2018  PHQ - 2 Score 1 1 1    THN CM Care Plan Problem One     Most Recent Value  Care Plan Problem One  Knowledge Deficit in Self Management of Diabetes  Role Documenting the Problem One  Health Coach  THN Long Term Goal   Patient  will verbalize  decrease in A1C from 8.0 with next blood draw  THN Long Term Goal Start Date  10/26/18  Interventions for Problem One Long Term Goal  RN disucssed the inportance of medication adherence and checking blood sugurs. RN willfollow upwith  further discussion  THN CM Short Term Goal #1   Patient will check blood sugars as per Dr ordered an document within the next 30 days  THN CM Short Term Goal #1 Start Date  10/26/18  Interventions for Short Term Goal #1  RN reiterated checking blood sugars as per ordered. Patient checks random. Patient has not purchased the talking meter. RN willfollow up within further discussion  THN CM Short Term Goal #2   Patient will verbalize making healthier snack choices within the next 30 days  THN CM Short Term Goal #2 Start Date  10/26/18  Interventions for Short Term Goal #2  RN reiterated eating healthy. Patient stated that over the holidays she had gottenoff. She has tried to eat healthy these last few weeks. RN will follow for patient compliance      Assessment: A1C 8.0 Patient has started on trulicity Patient has not purchased the talking glucose meter Patient will benefit from [employer] telephonic outreach for education and support for diabetes self management.  Plan:  RN reiterated healthy eating RN reiterated medication adherence RN sent a 2020 Calendar book for blood sugar documentation RN discussed Health Maintenance RN will follow up within the month of April  Lydia Foster BSN RN [facility] Care Management [phone]

## 2019-01-10 DIAGNOSIS — F339 Major depressive disorder, recurrent, unspecified: Secondary | ICD-10-CM | POA: Diagnosis not present

## 2019-01-10 DIAGNOSIS — M25531 Pain in right wrist: Secondary | ICD-10-CM | POA: Diagnosis not present

## 2019-01-10 DIAGNOSIS — K51919 Ulcerative colitis, unspecified with unspecified complications: Secondary | ICD-10-CM | POA: Diagnosis not present

## 2019-01-10 DIAGNOSIS — Z8669 Personal history of other diseases of the nervous system and sense organs: Secondary | ICD-10-CM | POA: Diagnosis not present

## 2019-01-10 DIAGNOSIS — E1140 Type 2 diabetes mellitus with diabetic neuropathy, unspecified: Secondary | ICD-10-CM | POA: Diagnosis not present

## 2019-01-17 DIAGNOSIS — H3552 Pigmentary retinal dystrophy: Secondary | ICD-10-CM | POA: Diagnosis not present

## 2019-01-17 DIAGNOSIS — E1165 Type 2 diabetes mellitus with hyperglycemia: Secondary | ICD-10-CM | POA: Diagnosis not present

## 2019-01-17 DIAGNOSIS — E1140 Type 2 diabetes mellitus with diabetic neuropathy, unspecified: Secondary | ICD-10-CM | POA: Diagnosis not present

## 2019-01-17 DIAGNOSIS — F339 Major depressive disorder, recurrent, unspecified: Secondary | ICD-10-CM | POA: Diagnosis not present

## 2019-01-17 DIAGNOSIS — K51919 Ulcerative colitis, unspecified with unspecified complications: Secondary | ICD-10-CM | POA: Diagnosis not present

## 2019-01-17 DIAGNOSIS — Z0000 Encounter for general adult medical examination without abnormal findings: Secondary | ICD-10-CM | POA: Diagnosis not present

## 2019-01-17 DIAGNOSIS — E786 Lipoprotein deficiency: Secondary | ICD-10-CM | POA: Diagnosis not present

## 2019-01-17 DIAGNOSIS — Z8669 Personal history of other diseases of the nervous system and sense organs: Secondary | ICD-10-CM | POA: Diagnosis not present

## 2019-01-23 ENCOUNTER — Other Ambulatory Visit: Payer: Self-pay | Admitting: *Deleted

## 2019-01-23 NOTE — Patient Outreach (Signed)
[facility]) Care Management  01/23/2019  Lydia Foster 1961-01-09 [id number]   RN Health Coach attempted follow up outreach call to patient.  Patient was unavailable. HIPPA compliance voicemail message left with return callback number.  Plan: RN will call patient again within 30 days.  Gean Maidens BSN RN [facility] Care Management [phone]

## 2019-01-24 ENCOUNTER — Other Ambulatory Visit: Payer: Self-pay | Admitting: *Deleted

## 2019-01-24 NOTE — Patient Outreach (Signed)
[facility]) Care Management  01/24/2019   Lydia Foster 11-Mar-1961 [id number]   RN Health Coach received  telephone call from patient.  Hipaa compliance verified. Per patient she went to the Dr on 01/17/2019. Per patient her A1C is 7.0. Patient fasting blood sugar is 123. Patient stated that her triglycerides are elevated. Patient has not been exercising. Per patient she has been wearing a mask and proper hand washing when she goes out. Patient stated that she is running out of food. Per patient her mother has been sick and in the hospital. She is being discharged today. Per patient she is taking medications as prescribed.  Patient has agreed to follow up  outreach calls.    Current Medications:  Current Outpatient Medications  Medication Sig Dispense Refill  . amoxicillin-clavulanate (AUGMENTIN) 875-125 MG tablet Take 1 tablet by mouth 2 (two) times daily. (Patient not taking: Reported on 10/26/2018) 14 tablet 0  . Diclofenac Potassium (CAMBIA) 50 MG PACK Take by mouth as needed.    . fluticasone (FLONASE) 50 MCG/ACT nasal spray Place 2 sprays into both nostrils daily.    Marland Kitchen gabapentin (NEURONTIN) 300 MG capsule Take 300 mg by mouth 3 (three) times daily.    Marland Kitchen glipiZIDE (GLUCOTROL XL) 10 MG 24 hr tablet Take 10 mg by mouth daily with breakfast.    . glipiZIDE (GLUCOTROL) 10 MG tablet Take 10 mg by mouth daily before breakfast.    . lisinopril (PRINIVIL,ZESTRIL) 10 MG tablet Take 10 mg by mouth daily.    Marland Kitchen LORazepam (ATIVAN) 0.5 MG tablet Take 0.5 mg by mouth every 8 (eight) hours as needed for anxiety.    . meloxicam (MOBIC) 7.5 MG tablet Take 7.5 mg by mouth daily.    . mesalamine (LIALDA) 1.2 g EC tablet Take by mouth daily with breakfast.    . metFORMIN (GLUCOPHAGE) 1000 MG tablet Take 1,000 mg by mouth 2 (two) times daily with a meal.    . Multiple Vitamin (MULTIVITAMIN) tablet Take 1 tablet by mouth daily.    . mupirocin ointment (BACTROBAN) 2 % Place 1 application  into the nose 3 (three) times daily.    Marland Kitchen nystatin cream (MYCOSTATIN) Apply 1 application topically 2 (two) times daily.    . sitaGLIPtin (JANUVIA) 100 MG tablet Take 100 mg by mouth daily.    Marland Kitchen venlafaxine XR (EFFEXOR-XR) 150 MG 24 hr capsule Take 150 mg by mouth daily with breakfast.    . vitamin C (ASCORBIC ACID) 500 MG tablet Take 500 mg by mouth daily.    . vitamin E 400 UNIT capsule Take 400 Units by mouth daily.     No current facility-administered medications for this visit.     Functional Status:  In your present state of health, do you have any difficulty performing the following activities: 10/26/2018 10/26/2018  Hearing? N N  Vision? Y Y  Comment - -  Difficulty concentrating or making decisions? N -  Walking or climbing stairs? Y Y  Dressing or bathing? N N  Doing errands, shopping? Lydia Foster  Preparing Food and eating ? N N  Using the Toilet? N N  In the past six months, have you accidently leaked urine? Y Y  Do you have problems with loss of bowel control? N N  Managing your Medications? N N  Managing your Finances? N N  Housekeeping or managing your Housekeeping? N N  Some recent data might be hidden    Fall/Depression Screening: Fall Risk  01/24/2019 10/26/2018  10/26/2018  Falls in the past year? 0 0 0  Number falls in past yr: - - -  Injury with Fall? - - -  Risk for fall due to : Impaired vision Impaired vision -  Follow up Falls evaluation completed Falls evaluation completed -   [facility] 2/9 Scores 01/24/2019 10/26/2018 10/26/2018 05/15/2018  PHQ - 2 Score _0 THN CM Care Plan Problem One     Most Recent Value  Care Plan Problem One  Knowledge Deficit in Self Management of Diabetes  Role Documenting the Problem One  [place] for Problem One  Active  THN Long Term Goal   Patient will verbalize  decrease in A1C from 8.0 with next blood draw  THN Long Term Goal Met Date  01/24/19  [facility] CM Short Term Goal #1   Patient will check blood sugars as per Dr  ordered an document within the next 30 days  THN CM Short Term Goal #1 Met Date  01/24/19  [facility] CM Short Term Goal #2   Patient will verbilize food high in triglycerides within the next 30 days  THN CM Short Term Goal #2 Start Date  01/24/19  Interventions for Short Term Goal #2  RN discussed patient hihg glycerides. RN discussed foods to help decrease triglycerides. RN discussed the financial hardship to purchase heathy foods. RN will follow up with furter discussio  THN CM Short Term Goal #3  Ptient will verbalize walking more around the home for exercising within the next 30 days  THN CM Short Term Goal #3 Start Date  01/24/19  Interventions for Short Tern Goal #3  RN discussed exercising. RN discussed walking around the home as a form of exercise. RN will follow up with further discussion      Assessment:  A1C 7 Fasting blood sugar is 123 today Patient is not exercising Patient is wearing mask and practicing safe pandemic care Per Patient she does not have adequate food supply Patient has elevated triglycerides   Plan:  Referred to social worker RN discussed walking exercise RN discussed coronavirus safety measures RN sent educational material on Triglyceride eating plan RN will follow up within the month of July  Lydia Foster [place] Management [phone]

## 2019-01-25 ENCOUNTER — Encounter: Payer: Self-pay | Admitting: *Deleted

## 2019-01-25 ENCOUNTER — Other Ambulatory Visit: Payer: Self-pay | Admitting: *Deleted

## 2019-01-25 NOTE — Patient Outreach (Addendum)
[facility]) Care Management  01/25/2019  Lydia Foster 1961/07/03 [id number]  CSW was able to make initial contact with patient today to perform phone assessment, as well as assess and assist with social work needs and services.  CSW introduced self, explained role and types of services provided through [employer] Care Management [facility] Management).  CSW further explained to patient that CSW works with patient's [employer] Health Coach, also with [facility] Care Management, Lydia Foster. CSW then explained the reason for the call, indicating that Lydia Foster thought that patient would benefit from social work services and resources to assist with obtaining food.  CSW obtained two HIPAA compliant identifiers from patient, which included patient's name and date of birth.  Patient indicated that she currently lives with her sister, mother and nephew and that they are all running out of food due to [place].  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  Patient reported that their main problem is that they cannot afford to buy food, especially during this pandemic.  Patient is also diabetic and unable to eat most of the food that is purchased because unhealthy food is cheaper and typically comes in larger quantities.  Patient in not interested in having CSW assist her with obtaining Food Stamps, through the [facility] of [facility], at this time, nor was patient interested in receiving transportation resources.  CSW agreed to mail, as well as email ([email or web address]), per patient's request, a list of food banks, food pantries and soup kitchens in [place].  Therefore, the following list of resources has been mailed and emailed to patient: [facility] [place], [place] [address] [phone] of [place], [place] [address] [phone]   [facility] Food Pantry [place], [place] [address] [phone]  [facility] [place] , [place] [address] [phone]  [facility] [place], [place] [address] [phone] - [employer] [place], [place] [address] [phone]  [facility] [employer] [place], [place] [address] [phone]  CSW has also emailed the following information to Lydia Foster, Dietitian with [facility], to request that patient receive 7 prepared meals, once per week, for the next 4 weeks to see if patient is eligible: Name, Address, Phone, Pertinent Diagnosis, Insurance, Food allergies.  CSW then agreed to follow-up with patient as soon as a response is received to inform patient of when she will begin receiving meals.  CSW will also follow-up with patient again next week, on Friday, Feb 01, 2019, around [place], to ensure that she is able to get someone to transport her to the food banks, food pantries and soup kitchens to obtain food for her and her family.  Danford Bad, BSW, MSW, LCSW  Licensed Restaurant manager, fast food Health System  Mailing [place] [address] [place], [place] [address] Physical Address-[address], [place], [place] [address] Toll Free Main # [phone] Fax # [phone] Cell # [phone]  Office # [phone] [email or web address]

## 2019-02-01 ENCOUNTER — Other Ambulatory Visit: Payer: Self-pay | Admitting: *Deleted

## 2019-02-01 ENCOUNTER — Encounter: Payer: Self-pay | Admitting: *Deleted

## 2019-02-01 NOTE — Patient Outreach (Signed)
[facility]) Care Management  02/01/2019  Lydia Foster Mar 27, 1961 [id number]   CSW was able to make contact with patient today to follow-up regarding referral for food assistance, as well as to ensure that patient received the list of resources mailed to her home by CSW.  Patient confirmed that she received the list of Food Banks, [employer] and [employer] in [place].  Patient indicated that it will not be a problem for her to arrange transportation, through friends and family members, to obtain food through the 7 resources that CSW provided to her.  Patient agreed to contact CSW directly if a letter of reference is required for any of the agencies where patient will be receiving food.  Patient admitted that she had not received a call from anyone with [facility] Management regarding receiving 7 prepared meals, once per week, for the next 4 weeks.  CSW was able to converse, via email, with Lydia Foster, Program Manager, and Lydia Foster, Quality Performance, both with [facility], to obtain an update.  Mrs. Lydia Foster reported that patient is on the schedule to be delivered her first set of meals today.  CSW explained this to patient, encouraging patient to contact CSW directly if for some reason the meals are not delivered to her home by this evening.  Patient denies having any additional social work needs at present.  Patient is aware that she is not eligible for [employer], through [employer] of [employer], due to age, and patient still denies wanting to complete an application for Food Stamps through the [place].  CSW will notify Lydia Foster, [place] with [facility], of CSW's plans to close patient's case.  CSW will perform a case closure on patient, as all goals of treatment have been met from social work standpoint. CSW will fax an update to patient's Primary Care Physician, Lydia Foster to  ensure that they are aware of CSW's involvement with patient's plan of care.    Lydia Foster, BSW, MSW, LCSW  Licensed Education officer, environmental Health System  Mailing [place] [address] [place], [place] [address] Physical Address-[address], [place], [place] [address] Toll Free Main # [phone] Fax # [phone] Cell # [phone]  Office # [phone] [email or web address]

## 2019-03-15 ENCOUNTER — Other Ambulatory Visit: Payer: Self-pay | Admitting: *Deleted

## 2019-03-15 NOTE — Patient Outreach (Signed)
[facility]) Care Management  03/15/2019  Lydia Foster 07-20-1961 [id number]   RN Health Coach is closing this program. Consumer is enrolled in [place] [facility] external program.  [name] Management [phone]

## 2019-04-25 ENCOUNTER — Ambulatory Visit: Payer: PPO | Admitting: *Deleted

## 2019-07-16 ENCOUNTER — Other Ambulatory Visit: Payer: Self-pay | Admitting: Obstetrics & Gynecology

## 2019-07-16 DIAGNOSIS — Z1231 Encounter for screening mammogram for malignant neoplasm of breast: Secondary | ICD-10-CM

## 2019-10-08 ENCOUNTER — Ambulatory Visit
Admission: RE | Admit: 2019-10-08 | Discharge: 2019-10-08 | Disposition: A | Discharge status: Home / Self Care | Payer: PPO | Source: Ambulatory Visit | Attending: Obstetrics & Gynecology | Admitting: Obstetrics & Gynecology

## 2019-10-08 DIAGNOSIS — Z1231 Encounter for screening mammogram for malignant neoplasm of breast: Secondary | ICD-10-CM | POA: Diagnosis not present

## 2019-10-14 DIAGNOSIS — I10 Essential (primary) hypertension: Secondary | ICD-10-CM | POA: Diagnosis not present

## 2019-10-14 DIAGNOSIS — E1169 Type 2 diabetes mellitus with other specified complication: Secondary | ICD-10-CM | POA: Diagnosis not present

## 2019-10-14 DIAGNOSIS — R7989 Other specified abnormal findings of blood chemistry: Secondary | ICD-10-CM | POA: Diagnosis not present

## 2019-10-14 DIAGNOSIS — R946 Abnormal results of thyroid function studies: Secondary | ICD-10-CM | POA: Diagnosis not present

## 2019-10-14 DIAGNOSIS — E1142 Type 2 diabetes mellitus with diabetic polyneuropathy: Secondary | ICD-10-CM | POA: Diagnosis not present

## 2019-10-14 DIAGNOSIS — E785 Hyperlipidemia, unspecified: Secondary | ICD-10-CM | POA: Diagnosis not present

## 2019-10-14 DIAGNOSIS — E1165 Type 2 diabetes mellitus with hyperglycemia: Secondary | ICD-10-CM | POA: Diagnosis not present

## 2019-10-25 DIAGNOSIS — E781 Pure hyperglyceridemia: Secondary | ICD-10-CM | POA: Diagnosis not present

## 2019-10-25 DIAGNOSIS — F339 Major depressive disorder, recurrent, unspecified: Secondary | ICD-10-CM | POA: Diagnosis not present

## 2019-10-25 DIAGNOSIS — E786 Lipoprotein deficiency: Secondary | ICD-10-CM | POA: Diagnosis not present

## 2019-10-25 DIAGNOSIS — H3552 Pigmentary retinal dystrophy: Secondary | ICD-10-CM | POA: Diagnosis not present

## 2019-10-25 DIAGNOSIS — Z8669 Personal history of other diseases of the nervous system and sense organs: Secondary | ICD-10-CM | POA: Diagnosis not present

## 2019-10-25 DIAGNOSIS — E1140 Type 2 diabetes mellitus with diabetic neuropathy, unspecified: Secondary | ICD-10-CM | POA: Diagnosis not present

## 2019-10-25 DIAGNOSIS — Z23 Encounter for immunization: Secondary | ICD-10-CM | POA: Diagnosis not present

## 2019-10-25 DIAGNOSIS — E1165 Type 2 diabetes mellitus with hyperglycemia: Secondary | ICD-10-CM | POA: Diagnosis not present

## 2019-12-03 DIAGNOSIS — J019 Acute sinusitis, unspecified: Secondary | ICD-10-CM | POA: Diagnosis not present

## 2019-12-03 DIAGNOSIS — L739 Follicular disorder, unspecified: Secondary | ICD-10-CM | POA: Diagnosis not present

## 2019-12-25 DIAGNOSIS — E1169 Type 2 diabetes mellitus with other specified complication: Secondary | ICD-10-CM | POA: Diagnosis not present

## 2019-12-25 DIAGNOSIS — E1142 Type 2 diabetes mellitus with diabetic polyneuropathy: Secondary | ICD-10-CM | POA: Diagnosis not present

## 2019-12-25 DIAGNOSIS — I10 Essential (primary) hypertension: Secondary | ICD-10-CM | POA: Diagnosis not present

## 2019-12-25 DIAGNOSIS — E785 Hyperlipidemia, unspecified: Secondary | ICD-10-CM | POA: Diagnosis not present

## 2019-12-25 DIAGNOSIS — R7989 Other specified abnormal findings of blood chemistry: Secondary | ICD-10-CM | POA: Diagnosis not present

## 2019-12-25 DIAGNOSIS — E1165 Type 2 diabetes mellitus with hyperglycemia: Secondary | ICD-10-CM | POA: Diagnosis not present

## 2020-02-19 DIAGNOSIS — E1140 Type 2 diabetes mellitus with diabetic neuropathy, unspecified: Secondary | ICD-10-CM | POA: Diagnosis not present

## 2020-02-19 DIAGNOSIS — E786 Lipoprotein deficiency: Secondary | ICD-10-CM | POA: Diagnosis not present

## 2020-02-19 DIAGNOSIS — G5793 Unspecified mononeuropathy of bilateral lower limbs: Secondary | ICD-10-CM | POA: Diagnosis not present

## 2020-02-19 DIAGNOSIS — Z79899 Other long term (current) drug therapy: Secondary | ICD-10-CM | POA: Diagnosis not present

## 2020-02-19 DIAGNOSIS — R7989 Other specified abnormal findings of blood chemistry: Secondary | ICD-10-CM | POA: Diagnosis not present

## 2020-02-19 DIAGNOSIS — E781 Pure hyperglyceridemia: Secondary | ICD-10-CM | POA: Diagnosis not present

## 2020-03-26 DIAGNOSIS — R7989 Other specified abnormal findings of blood chemistry: Secondary | ICD-10-CM | POA: Diagnosis not present

## 2020-03-26 DIAGNOSIS — E1169 Type 2 diabetes mellitus with other specified complication: Secondary | ICD-10-CM | POA: Diagnosis not present

## 2020-03-26 DIAGNOSIS — E1165 Type 2 diabetes mellitus with hyperglycemia: Secondary | ICD-10-CM | POA: Diagnosis not present

## 2020-03-26 DIAGNOSIS — E785 Hyperlipidemia, unspecified: Secondary | ICD-10-CM | POA: Diagnosis not present

## 2020-03-26 DIAGNOSIS — I10 Essential (primary) hypertension: Secondary | ICD-10-CM | POA: Diagnosis not present

## 2020-03-26 DIAGNOSIS — E1142 Type 2 diabetes mellitus with diabetic polyneuropathy: Secondary | ICD-10-CM | POA: Diagnosis not present

## 2020-04-02 DIAGNOSIS — E1165 Type 2 diabetes mellitus with hyperglycemia: Secondary | ICD-10-CM | POA: Diagnosis not present

## 2020-04-02 DIAGNOSIS — E786 Lipoprotein deficiency: Secondary | ICD-10-CM | POA: Diagnosis not present

## 2020-04-02 DIAGNOSIS — Z79899 Other long term (current) drug therapy: Secondary | ICD-10-CM | POA: Diagnosis not present

## 2020-04-02 DIAGNOSIS — Z8669 Personal history of other diseases of the nervous system and sense organs: Secondary | ICD-10-CM | POA: Diagnosis not present

## 2020-04-02 DIAGNOSIS — Z0000 Encounter for general adult medical examination without abnormal findings: Secondary | ICD-10-CM | POA: Diagnosis not present

## 2020-04-02 DIAGNOSIS — K51919 Ulcerative colitis, unspecified with unspecified complications: Secondary | ICD-10-CM | POA: Diagnosis not present

## 2020-04-30 DIAGNOSIS — I10 Essential (primary) hypertension: Secondary | ICD-10-CM | POA: Diagnosis not present

## 2020-04-30 DIAGNOSIS — E1142 Type 2 diabetes mellitus with diabetic polyneuropathy: Secondary | ICD-10-CM | POA: Diagnosis not present

## 2020-04-30 DIAGNOSIS — R7989 Other specified abnormal findings of blood chemistry: Secondary | ICD-10-CM | POA: Diagnosis not present

## 2020-04-30 DIAGNOSIS — E1165 Type 2 diabetes mellitus with hyperglycemia: Secondary | ICD-10-CM | POA: Diagnosis not present

## 2020-04-30 DIAGNOSIS — E1169 Type 2 diabetes mellitus with other specified complication: Secondary | ICD-10-CM | POA: Diagnosis not present

## 2020-04-30 DIAGNOSIS — E785 Hyperlipidemia, unspecified: Secondary | ICD-10-CM | POA: Diagnosis not present

## 2020-05-22 DIAGNOSIS — H548 Legal blindness, as defined in USA: Secondary | ICD-10-CM | POA: Diagnosis not present

## 2020-05-22 DIAGNOSIS — E1142 Type 2 diabetes mellitus with diabetic polyneuropathy: Secondary | ICD-10-CM | POA: Diagnosis not present

## 2020-05-22 DIAGNOSIS — H3552 Pigmentary retinal dystrophy: Secondary | ICD-10-CM | POA: Diagnosis not present

## 2020-05-22 DIAGNOSIS — G43719 Chronic migraine without aura, intractable, without status migrainosus: Secondary | ICD-10-CM | POA: Diagnosis not present

## 2020-05-22 DIAGNOSIS — F5104 Psychophysiologic insomnia: Secondary | ICD-10-CM | POA: Diagnosis not present

## 2020-06-26 DIAGNOSIS — E1165 Type 2 diabetes mellitus with hyperglycemia: Secondary | ICD-10-CM | POA: Diagnosis not present

## 2020-06-26 DIAGNOSIS — Z79899 Other long term (current) drug therapy: Secondary | ICD-10-CM | POA: Diagnosis not present

## 2020-06-26 DIAGNOSIS — E786 Lipoprotein deficiency: Secondary | ICD-10-CM | POA: Diagnosis not present

## 2020-06-29 DIAGNOSIS — E119 Type 2 diabetes mellitus without complications: Secondary | ICD-10-CM | POA: Diagnosis not present

## 2020-07-03 DIAGNOSIS — E1165 Type 2 diabetes mellitus with hyperglycemia: Secondary | ICD-10-CM | POA: Diagnosis not present

## 2020-07-03 DIAGNOSIS — I10 Essential (primary) hypertension: Secondary | ICD-10-CM | POA: Diagnosis not present

## 2020-07-03 DIAGNOSIS — K51919 Ulcerative colitis, unspecified with unspecified complications: Secondary | ICD-10-CM | POA: Diagnosis not present

## 2020-07-03 DIAGNOSIS — E786 Lipoprotein deficiency: Secondary | ICD-10-CM | POA: Diagnosis not present

## 2020-07-03 DIAGNOSIS — Z79899 Other long term (current) drug therapy: Secondary | ICD-10-CM | POA: Diagnosis not present

## 2020-07-03 DIAGNOSIS — Z23 Encounter for immunization: Secondary | ICD-10-CM | POA: Diagnosis not present

## 2020-08-06 DIAGNOSIS — Z20822 Contact with and (suspected) exposure to covid-19: Secondary | ICD-10-CM | POA: Diagnosis not present

## 2020-08-06 DIAGNOSIS — J3489 Other specified disorders of nose and nasal sinuses: Secondary | ICD-10-CM | POA: Diagnosis not present

## 2020-08-12 DIAGNOSIS — E1169 Type 2 diabetes mellitus with other specified complication: Secondary | ICD-10-CM | POA: Diagnosis not present

## 2020-08-12 DIAGNOSIS — E785 Hyperlipidemia, unspecified: Secondary | ICD-10-CM | POA: Diagnosis not present

## 2020-08-12 DIAGNOSIS — E1165 Type 2 diabetes mellitus with hyperglycemia: Secondary | ICD-10-CM | POA: Diagnosis not present

## 2020-08-12 DIAGNOSIS — R7989 Other specified abnormal findings of blood chemistry: Secondary | ICD-10-CM | POA: Diagnosis not present

## 2020-08-12 DIAGNOSIS — E1142 Type 2 diabetes mellitus with diabetic polyneuropathy: Secondary | ICD-10-CM | POA: Diagnosis not present

## 2020-08-12 DIAGNOSIS — I10 Essential (primary) hypertension: Secondary | ICD-10-CM | POA: Diagnosis not present

## 2020-08-27 IMAGING — MG DIGITAL SCREENING BILAT W/ TOMO W/ CAD
8 series · 8 of 24 positions shown · non-contrast
Comparison: Previous exam(s).

CLINICAL DATA: Screening.

EXAM:
DIGITAL SCREENING BILATERAL MAMMOGRAM WITH TOMO AND CAD

[L CC synth-2D]
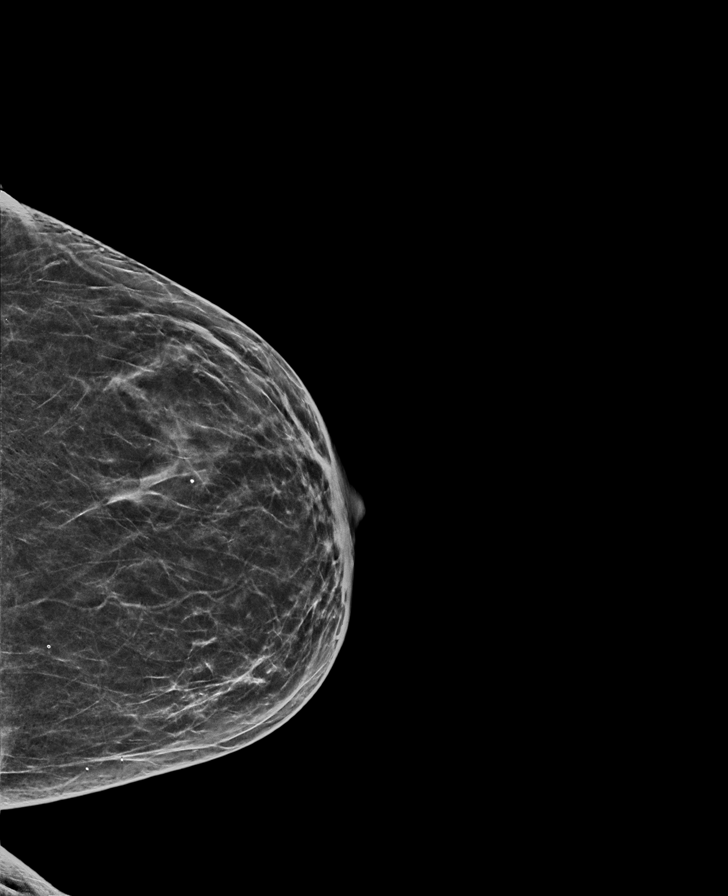

[R MLO synth-2D]
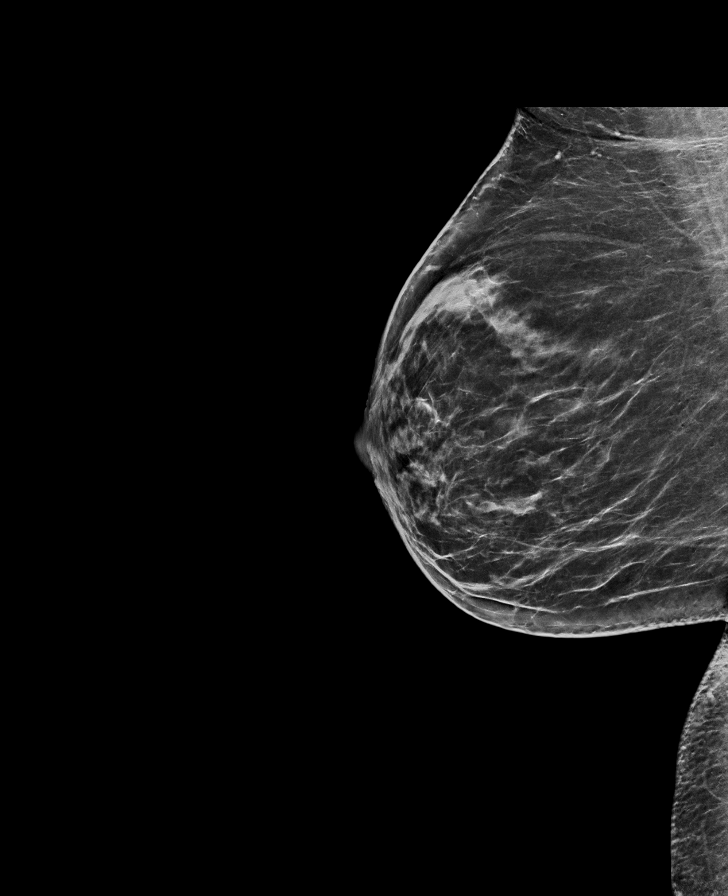

[R CC synth-2D]
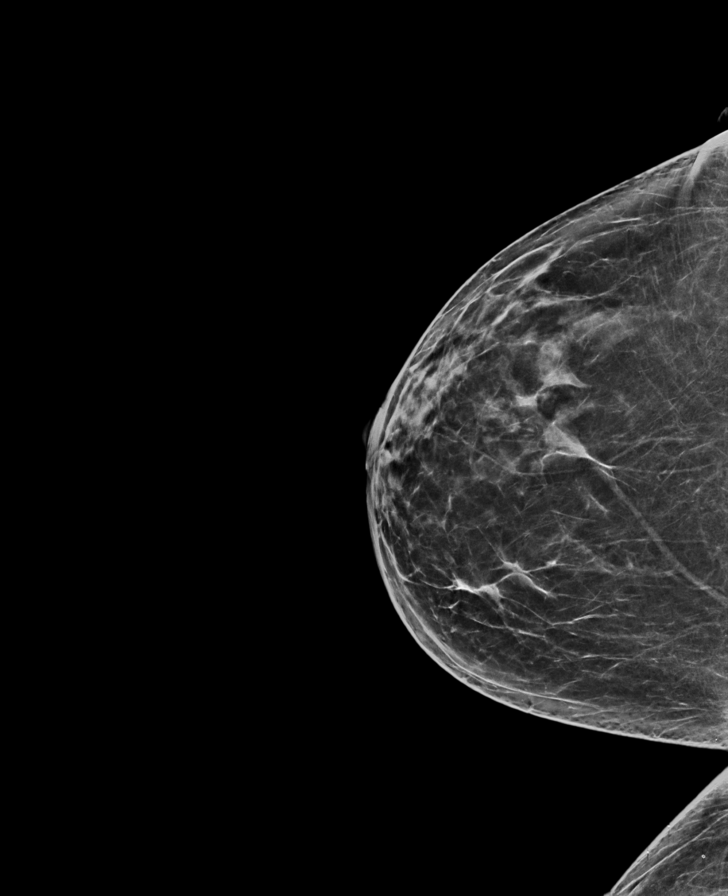

[L MLO synth-2D]
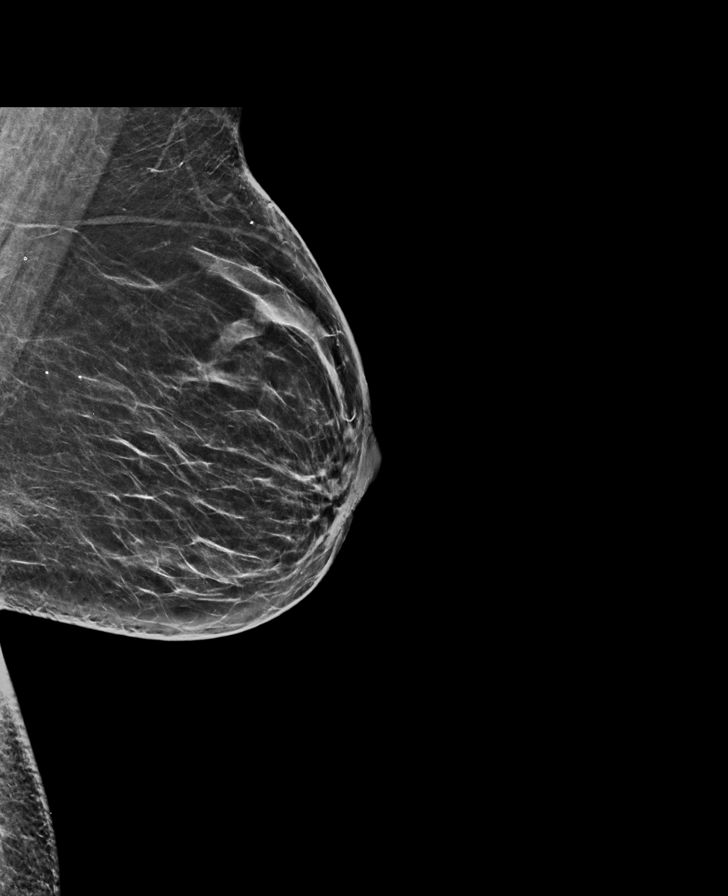

[L MLO tomo · tomo slice 33/66.0]
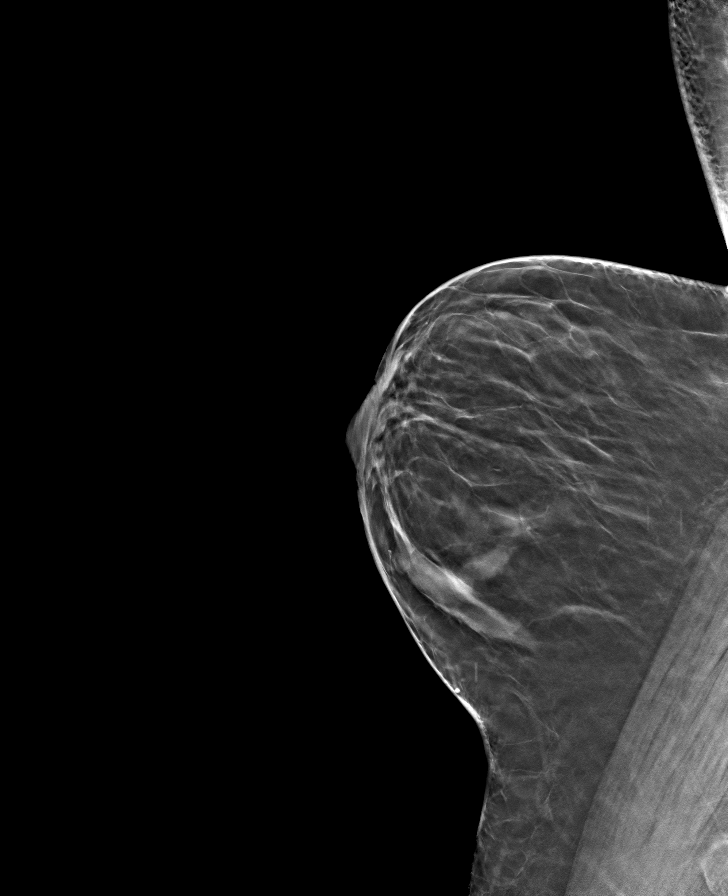

[R MLO tomo · tomo slice 35/70.0]
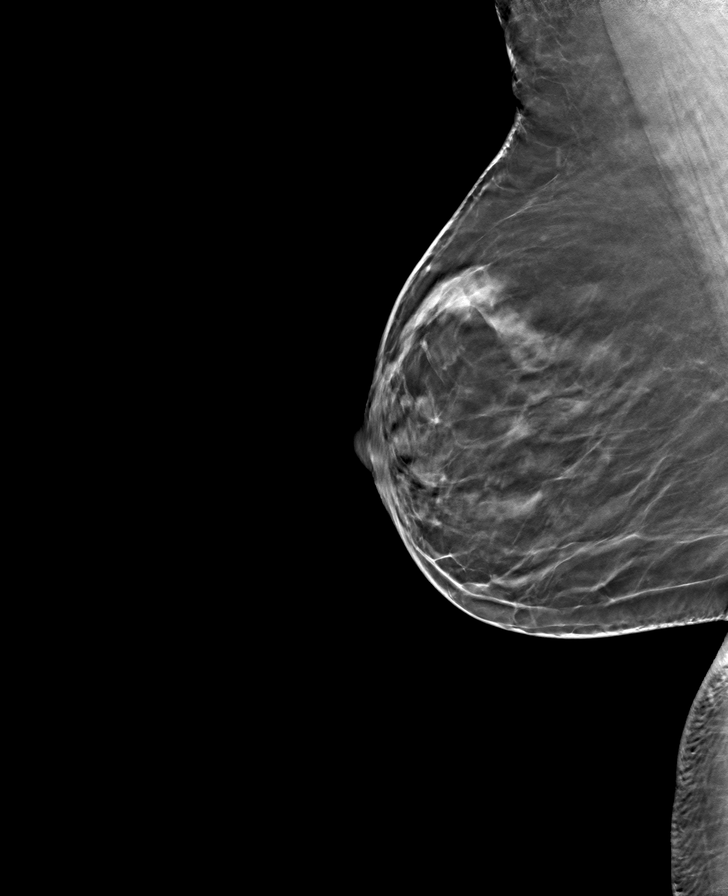

[R CC tomo · tomo slice 35/68.0]
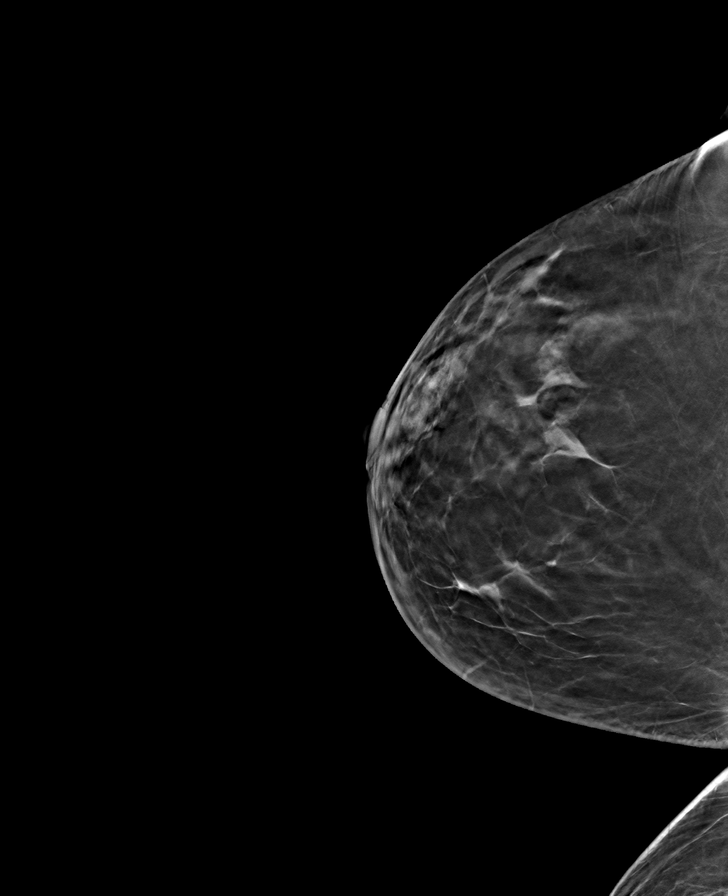

[L CC tomo · tomo slice 30/59.0]
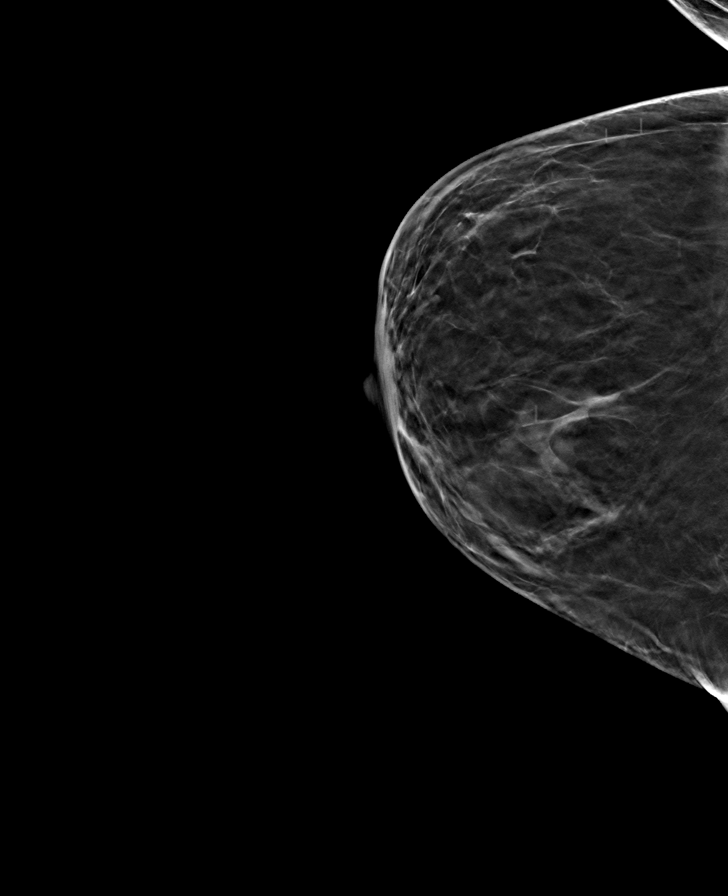

[8 of 24 positions shown; findings below may reference images not displayed]

ACR Breast Density Category b: There are scattered areas of
fibroglandular density.
FINDINGS: There are no findings suspicious for malignancy. Images were
processed with CAD.
IMPRESSION: No mammographic evidence of malignancy. A result letter of this
screening mammogram will be mailed directly to the patient.

RECOMMENDATION:
Screening mammogram in one year. (Code:[id number])

BI-RADS CATEGORY  1: Negative.

## 2020-10-22 DIAGNOSIS — Z79899 Other long term (current) drug therapy: Secondary | ICD-10-CM | POA: Diagnosis not present

## 2020-10-22 DIAGNOSIS — E786 Lipoprotein deficiency: Secondary | ICD-10-CM | POA: Diagnosis not present

## 2020-10-22 DIAGNOSIS — E1165 Type 2 diabetes mellitus with hyperglycemia: Secondary | ICD-10-CM | POA: Diagnosis not present

## 2020-10-28 DIAGNOSIS — Z0000 Encounter for general adult medical examination without abnormal findings: Secondary | ICD-10-CM | POA: Diagnosis not present

## 2020-10-28 DIAGNOSIS — I10 Essential (primary) hypertension: Secondary | ICD-10-CM | POA: Diagnosis not present

## 2020-10-28 DIAGNOSIS — M79642 Pain in left hand: Secondary | ICD-10-CM | POA: Diagnosis not present

## 2020-10-28 DIAGNOSIS — E1165 Type 2 diabetes mellitus with hyperglycemia: Secondary | ICD-10-CM | POA: Diagnosis not present

## 2020-10-28 DIAGNOSIS — F339 Major depressive disorder, recurrent, unspecified: Secondary | ICD-10-CM | POA: Diagnosis not present

## 2020-10-28 DIAGNOSIS — Z79899 Other long term (current) drug therapy: Secondary | ICD-10-CM | POA: Diagnosis not present

## 2020-10-28 DIAGNOSIS — E786 Lipoprotein deficiency: Secondary | ICD-10-CM | POA: Diagnosis not present

## 2020-12-21 DIAGNOSIS — M65342 Trigger finger, left ring finger: Secondary | ICD-10-CM | POA: Diagnosis not present

## 2020-12-24 DIAGNOSIS — F5104 Psychophysiologic insomnia: Secondary | ICD-10-CM | POA: Diagnosis not present

## 2020-12-24 DIAGNOSIS — G43719 Chronic migraine without aura, intractable, without status migrainosus: Secondary | ICD-10-CM | POA: Diagnosis not present

## 2020-12-24 DIAGNOSIS — H548 Legal blindness, as defined in USA: Secondary | ICD-10-CM | POA: Diagnosis not present

## 2020-12-24 DIAGNOSIS — H3552 Pigmentary retinal dystrophy: Secondary | ICD-10-CM | POA: Diagnosis not present

## 2021-03-31 DIAGNOSIS — E785 Hyperlipidemia, unspecified: Secondary | ICD-10-CM | POA: Diagnosis not present

## 2021-03-31 DIAGNOSIS — E1165 Type 2 diabetes mellitus with hyperglycemia: Secondary | ICD-10-CM | POA: Diagnosis not present

## 2021-03-31 DIAGNOSIS — I10 Essential (primary) hypertension: Secondary | ICD-10-CM | POA: Diagnosis not present

## 2021-03-31 DIAGNOSIS — E1169 Type 2 diabetes mellitus with other specified complication: Secondary | ICD-10-CM | POA: Diagnosis not present

## 2021-05-03 DIAGNOSIS — E786 Lipoprotein deficiency: Secondary | ICD-10-CM | POA: Diagnosis not present

## 2021-05-03 DIAGNOSIS — Z79899 Other long term (current) drug therapy: Secondary | ICD-10-CM | POA: Diagnosis not present

## 2021-05-03 DIAGNOSIS — I10 Essential (primary) hypertension: Secondary | ICD-10-CM | POA: Diagnosis not present

## 2021-05-03 DIAGNOSIS — E1165 Type 2 diabetes mellitus with hyperglycemia: Secondary | ICD-10-CM | POA: Diagnosis not present

## 2021-05-10 DIAGNOSIS — M65342 Trigger finger, left ring finger: Secondary | ICD-10-CM | POA: Diagnosis not present

## 2021-05-13 DIAGNOSIS — E1165 Type 2 diabetes mellitus with hyperglycemia: Secondary | ICD-10-CM | POA: Diagnosis not present

## 2021-05-13 DIAGNOSIS — I10 Essential (primary) hypertension: Secondary | ICD-10-CM | POA: Diagnosis not present

## 2021-05-13 DIAGNOSIS — E786 Lipoprotein deficiency: Secondary | ICD-10-CM | POA: Diagnosis not present

## 2021-07-19 DIAGNOSIS — H269 Unspecified cataract: Secondary | ICD-10-CM | POA: Diagnosis not present

## 2021-07-29 DIAGNOSIS — Z01419 Encounter for gynecological examination (general) (routine) without abnormal findings: Secondary | ICD-10-CM | POA: Diagnosis not present

## 2021-07-29 DIAGNOSIS — Z23 Encounter for immunization: Secondary | ICD-10-CM | POA: Diagnosis not present

## 2021-07-29 DIAGNOSIS — I10 Essential (primary) hypertension: Secondary | ICD-10-CM | POA: Diagnosis not present

## 2021-07-29 DIAGNOSIS — E1165 Type 2 diabetes mellitus with hyperglycemia: Secondary | ICD-10-CM | POA: Diagnosis not present

## 2021-07-29 DIAGNOSIS — E786 Lipoprotein deficiency: Secondary | ICD-10-CM | POA: Diagnosis not present

## 2021-07-29 DIAGNOSIS — Z124 Encounter for screening for malignant neoplasm of cervix: Secondary | ICD-10-CM | POA: Diagnosis not present

## 2021-10-14 ENCOUNTER — Other Ambulatory Visit: Payer: Self-pay | Admitting: Internal Medicine

## 2021-10-14 DIAGNOSIS — E786 Lipoprotein deficiency: Secondary | ICD-10-CM | POA: Diagnosis not present

## 2021-10-14 DIAGNOSIS — R1010 Upper abdominal pain, unspecified: Secondary | ICD-10-CM | POA: Diagnosis not present

## 2021-10-14 DIAGNOSIS — F331 Major depressive disorder, recurrent, moderate: Secondary | ICD-10-CM | POA: Diagnosis not present

## 2021-10-14 DIAGNOSIS — E1165 Type 2 diabetes mellitus with hyperglycemia: Secondary | ICD-10-CM | POA: Diagnosis not present

## 2021-10-14 DIAGNOSIS — I10 Essential (primary) hypertension: Secondary | ICD-10-CM | POA: Diagnosis not present

## 2021-10-22 ENCOUNTER — Other Ambulatory Visit: Payer: Self-pay

## 2021-10-22 ENCOUNTER — Ambulatory Visit
Admission: RE | Admit: 2021-10-22 | Discharge: 2021-10-22 | Disposition: A | Discharge status: Home / Self Care | Payer: PPO | Source: Ambulatory Visit | Attending: Internal Medicine | Admitting: Internal Medicine

## 2021-10-22 DIAGNOSIS — R1010 Upper abdominal pain, unspecified: Secondary | ICD-10-CM | POA: Insufficient documentation

## 2021-11-26 DIAGNOSIS — Z79899 Other long term (current) drug therapy: Secondary | ICD-10-CM | POA: Diagnosis not present

## 2021-11-26 DIAGNOSIS — E786 Lipoprotein deficiency: Secondary | ICD-10-CM | POA: Diagnosis not present

## 2021-11-26 DIAGNOSIS — F331 Major depressive disorder, recurrent, moderate: Secondary | ICD-10-CM | POA: Diagnosis not present

## 2021-11-26 DIAGNOSIS — I10 Essential (primary) hypertension: Secondary | ICD-10-CM | POA: Diagnosis not present

## 2021-11-26 DIAGNOSIS — Z0000 Encounter for general adult medical examination without abnormal findings: Secondary | ICD-10-CM | POA: Diagnosis not present

## 2021-11-26 DIAGNOSIS — E1165 Type 2 diabetes mellitus with hyperglycemia: Secondary | ICD-10-CM | POA: Diagnosis not present

## 2021-11-26 DIAGNOSIS — G629 Polyneuropathy, unspecified: Secondary | ICD-10-CM | POA: Diagnosis not present

## 2021-11-26 DIAGNOSIS — Z1231 Encounter for screening mammogram for malignant neoplasm of breast: Secondary | ICD-10-CM | POA: Diagnosis not present

## 2021-12-27 ENCOUNTER — Other Ambulatory Visit: Payer: Self-pay | Admitting: Internal Medicine

## 2021-12-27 DIAGNOSIS — Z1231 Encounter for screening mammogram for malignant neoplasm of breast: Secondary | ICD-10-CM

## 2021-12-29 DIAGNOSIS — E871 Hypo-osmolality and hyponatremia: Secondary | ICD-10-CM | POA: Diagnosis not present

## 2022-02-09 DIAGNOSIS — I10 Essential (primary) hypertension: Secondary | ICD-10-CM | POA: Diagnosis not present

## 2022-02-09 DIAGNOSIS — R296 Repeated falls: Secondary | ICD-10-CM | POA: Diagnosis not present

## 2022-02-09 DIAGNOSIS — R079 Chest pain, unspecified: Secondary | ICD-10-CM | POA: Diagnosis not present

## 2022-02-09 DIAGNOSIS — R0789 Other chest pain: Secondary | ICD-10-CM | POA: Diagnosis not present

## 2022-02-09 DIAGNOSIS — E871 Hypo-osmolality and hyponatremia: Secondary | ICD-10-CM | POA: Diagnosis not present

## 2022-02-09 DIAGNOSIS — E1165 Type 2 diabetes mellitus with hyperglycemia: Secondary | ICD-10-CM | POA: Diagnosis not present

## 2022-02-23 DIAGNOSIS — L0292 Furuncle, unspecified: Secondary | ICD-10-CM | POA: Diagnosis not present

## 2022-02-23 DIAGNOSIS — B3731 Acute candidiasis of vulva and vagina: Secondary | ICD-10-CM | POA: Diagnosis not present

## 2022-02-23 DIAGNOSIS — S31829A Unspecified open wound of left buttock, initial encounter: Secondary | ICD-10-CM | POA: Diagnosis not present

## 2022-02-23 DIAGNOSIS — R2689 Other abnormalities of gait and mobility: Secondary | ICD-10-CM | POA: Diagnosis not present

## 2022-02-23 DIAGNOSIS — S31809A Unspecified open wound of unspecified buttock, initial encounter: Secondary | ICD-10-CM | POA: Diagnosis not present

## 2022-03-30 ENCOUNTER — Ambulatory Visit
Admission: RE | Admit: 2022-03-30 | Discharge: 2022-03-30 | Disposition: A | Discharge status: Home / Self Care | Payer: PPO | Source: Ambulatory Visit | Attending: Internal Medicine | Admitting: Internal Medicine

## 2022-03-30 DIAGNOSIS — Z1231 Encounter for screening mammogram for malignant neoplasm of breast: Secondary | ICD-10-CM | POA: Insufficient documentation

## 2022-05-20 DIAGNOSIS — R58 Hemorrhage, not elsewhere classified: Secondary | ICD-10-CM | POA: Diagnosis not present

## 2022-05-20 DIAGNOSIS — I10 Essential (primary) hypertension: Secondary | ICD-10-CM | POA: Diagnosis not present

## 2022-05-21 DIAGNOSIS — Z87891 Personal history of nicotine dependence: Secondary | ICD-10-CM | POA: Diagnosis not present

## 2022-05-21 DIAGNOSIS — S61210A Laceration without foreign body of right index finger without damage to nail, initial encounter: Secondary | ICD-10-CM | POA: Diagnosis not present

## 2022-05-21 DIAGNOSIS — W25XXXA Contact with sharp glass, initial encounter: Secondary | ICD-10-CM | POA: Diagnosis not present

## 2022-05-24 ENCOUNTER — Emergency Department
Admission: EM | Admit: 2022-05-24 | Discharge: 2022-05-24 | Disposition: A | Discharge status: Home / Self Care | Payer: PPO | Attending: Emergency Medicine | Admitting: Emergency Medicine

## 2022-05-24 ENCOUNTER — Emergency Department: Payer: PPO

## 2022-05-24 ENCOUNTER — Other Ambulatory Visit: Payer: Self-pay

## 2022-05-24 DIAGNOSIS — Z20822 Contact with and (suspected) exposure to covid-19: Secondary | ICD-10-CM | POA: Insufficient documentation

## 2022-05-24 DIAGNOSIS — R441 Visual hallucinations: Secondary | ICD-10-CM

## 2022-05-24 DIAGNOSIS — K51919 Ulcerative colitis, unspecified with unspecified complications: Secondary | ICD-10-CM | POA: Diagnosis not present

## 2022-05-24 DIAGNOSIS — E119 Type 2 diabetes mellitus without complications: Secondary | ICD-10-CM | POA: Diagnosis not present

## 2022-05-24 DIAGNOSIS — F32A Depression, unspecified: Secondary | ICD-10-CM | POA: Diagnosis not present

## 2022-05-24 DIAGNOSIS — F4321 Adjustment disorder with depressed mood: Secondary | ICD-10-CM | POA: Diagnosis not present

## 2022-05-24 DIAGNOSIS — Z79899 Other long term (current) drug therapy: Secondary | ICD-10-CM | POA: Insufficient documentation

## 2022-05-24 DIAGNOSIS — Z87891 Personal history of nicotine dependence: Secondary | ICD-10-CM | POA: Diagnosis not present

## 2022-05-24 DIAGNOSIS — S0990XA Unspecified injury of head, initial encounter: Secondary | ICD-10-CM | POA: Diagnosis not present

## 2022-05-24 DIAGNOSIS — R442 Other hallucinations: Secondary | ICD-10-CM | POA: Diagnosis not present

## 2022-05-24 DIAGNOSIS — R739 Hyperglycemia, unspecified: Secondary | ICD-10-CM | POA: Diagnosis not present

## 2022-05-24 LAB — CBC
HCT: 39.3 % (ref 36.0–46.0)
Hemoglobin: 12.8 g/dL (ref 12.0–15.0)
MCH: 28.2 pg (ref 26.0–34.0)
MCHC: 32.6 g/dL (ref 30.0–36.0)
MCV: 86.6 fL (ref 80.0–100.0)
Platelets: 238 10*3/uL (ref 150–400)
RBC: 4.54 MIL/uL (ref 3.87–5.11)
RDW: 13.2 % (ref 11.5–15.5)
WBC: 4.7 10*3/uL (ref 4.0–10.5)
nRBC: 0 % (ref 0.0–0.2)

## 2022-05-24 LAB — ACETAMINOPHEN LEVEL: Acetaminophen (Tylenol), Serum: <10 ug/mL — ABNORMAL LOW (ref 10–30)

## 2022-05-24 LAB — URINALYSIS, ROUTINE W REFLEX MICROSCOPIC
Bacteria, UA: NONE SEEN
Bilirubin Urine: NEGATIVE
Glucose, UA: >=500 mg/dL — AB
Hgb urine dipstick: NEGATIVE
Ketones, ur: NEGATIVE mg/dL
Leukocytes,Ua: NEGATIVE
Nitrite: NEGATIVE
Protein, ur: NEGATIVE mg/dL
Specific Gravity, Urine: 1.03 (ref 1.005–1.030)
Squamous Epithelial / HPF: NONE SEEN (ref 0–5)
pH: 5 (ref 5.0–8.0)

## 2022-05-24 LAB — URINE DRUG SCREEN, QUALITATIVE (ARMC ONLY)
Amphetamines, Ur Screen: NOT DETECTED
Barbiturates, Ur Screen: NOT DETECTED
Benzodiazepine, Ur Scrn: NOT DETECTED
Cannabinoid 50 Ng, Ur ~~LOC~~: NOT DETECTED
Cocaine Metabolite,Ur ~~LOC~~: NOT DETECTED
MDMA (Ecstasy)Ur Screen: NOT DETECTED
Methadone Scn, Ur: NOT DETECTED
Opiate, Ur Screen: NOT DETECTED
Phencyclidine (PCP) Ur S: NOT DETECTED
Tricyclic, Ur Screen: NOT DETECTED

## 2022-05-24 LAB — CBG MONITORING, ED: Glucose-Capillary: 303 mg/dL — ABNORMAL HIGH (ref 70–99)

## 2022-05-24 LAB — COMPREHENSIVE METABOLIC PANEL
ALT: 11 U/L (ref 0–44)
AST: 19 U/L (ref 15–41)
Albumin: 4.1 g/dL (ref 3.5–5.0)
Alkaline Phosphatase: 69 U/L (ref 38–126)
Anion gap: 8 (ref 5–15)
BUN: 11 mg/dL (ref 8–23)
CO2: 24 mmol/L (ref 22–32)
Calcium: 9.2 mg/dL (ref 8.9–10.3)
Chloride: 107 mmol/L (ref 98–111)
Creatinine, Ser: 0.82 mg/dL (ref 0.44–1.00)
GFR, Estimated: >60 mL/min (ref >60)
Glucose, Bld: 371 mg/dL — ABNORMAL HIGH (ref 70–99)
Potassium: 3.8 mmol/L (ref 3.5–5.1)
Sodium: 139 mmol/L (ref 135–145)
Total Bilirubin: 0.6 mg/dL (ref 0.3–1.2)
Total Protein: 7.5 g/dL (ref 6.5–8.1)

## 2022-05-24 LAB — RESP PANEL BY RT-PCR (FLU A&B, COVID) ARPGX2
Influenza A by PCR: NEGATIVE
Influenza B by PCR: NEGATIVE
SARS Coronavirus 2 by RT PCR: NEGATIVE

## 2022-05-24 LAB — ETHANOL: Alcohol, Ethyl (B): <10 mg/dL (ref <10)

## 2022-05-24 LAB — SALICYLATE LEVEL: Salicylate Lvl: <7 mg/dL — ABNORMAL LOW (ref 7.0–30.0)

## 2022-05-24 MED ORDER — VITAMIN D 25 MCG (1000 UNIT) PO TABS
25.0000 ug | ORAL_TABLET | Freq: Every day | ORAL | Status: DC
Start: 2022-05-24 — End: 2022-05-25

## 2022-05-24 MED ORDER — LISINOPRIL 10 MG PO TABS: 10.0000 mg | ORAL_TABLET | Freq: Every day | ORAL | Status: DC

## 2022-05-24 MED ORDER — METFORMIN HCL 500 MG PO TABS
1000.0000 mg | ORAL_TABLET | Freq: Once | ORAL | Status: AC
  Administered 2022-05-24: 1000 mg via ORAL
  Filled 2022-05-24: qty 2

## 2022-05-24 MED ORDER — MESALAMINE 1.2 G PO TBEC: 1.2000 g | DELAYED_RELEASE_TABLET | Freq: Every day | ORAL | Status: DC

## 2022-05-24 MED ORDER — FENOFIBRATE 54 MG PO TABS: 54.0000 mg | ORAL_TABLET | Freq: Every day | ORAL | Status: DC

## 2022-05-24 MED ORDER — QUETIAPINE FUMARATE 25 MG PO TABS
50.0000 mg | ORAL_TABLET | Freq: Every day | ORAL | Status: DC
  Administered 2022-05-24: 50 mg via ORAL
  Filled 2022-05-24: qty 2

## 2022-05-24 MED ORDER — VENLAFAXINE HCL ER 150 MG PO CP24: 150.0000 mg | ORAL_CAPSULE | Freq: Every day | ORAL | Status: DC

## 2022-05-24 MED ORDER — TOPIRAMATE 25 MG PO TABS
25.0000 mg | ORAL_TABLET | Freq: Every day | ORAL | Status: DC
  Administered 2022-05-24: 25 mg via ORAL
  Filled 2022-05-24: qty 1

## 2022-05-24 MED ORDER — VITAMIN C 500 MG PO TABS: 500.0000 mg | ORAL_TABLET | Freq: Every day | ORAL | Status: DC

## 2022-05-24 MED ORDER — VITAMIN E 45 MG (100 UNIT) PO CAPS
400.0000 [IU] | ORAL_CAPSULE | Freq: Every day | ORAL | Status: DC
Start: 2022-05-25 — End: 2022-05-25

## 2022-05-24 MED ORDER — METFORMIN HCL 500 MG PO TABS: 1000.0000 mg | ORAL_TABLET | Freq: Two times a day (BID) | ORAL | Status: DC

## 2022-05-24 MED ORDER — MESALAMINE 1.2 G PO TBEC
1.2000 g | DELAYED_RELEASE_TABLET | Freq: Every evening | ORAL | Status: DC
  Filled 2022-05-24: qty 1

## 2022-05-24 MED ORDER — GABAPENTIN 300 MG PO CAPS: 300.0000 mg | ORAL_CAPSULE | Freq: Every day | ORAL | Status: DC

## 2022-05-24 NOTE — Discharge Instructions (Signed)
Take your mental health medications as prescribed.  Return to the ER for any new or worsening symptoms such as worsening depression or any thoughts of wanting to harm yourself or anyone else.  The CT of your head shows an increased size of the ventricles (fluid-filled areas) of your brain.  This can be due to something called normal pressure hydrocephalus which can cause unsteadiness and falls.  You should follow-up with your primary care doctor as you may need referral to neurology or further work-up if your symptoms persist.

## 2022-05-24 NOTE — ED Notes (Signed)
Pt to be discharged. Belongings bag 1 of 1 returned so pt can make phone calls to attempt to find ride

## 2022-05-24 NOTE — ED Provider Notes (Signed)
[facility] Provider Note    Event Date/Time   First MD Initiated Contact with Patient 05/24/22 1633     (approximate)   History   Psychiatric Evaluation   HPI  Lydia Foster is a 61 y.o. female with a history of diabetes, anxiety, arthritis, migraine, and depression who presents with increased depressive thoughts, grief, and concern from people who know her that she has been confused.  The patient states that she has also fallen a couple times although states she does not feel weak or lightheaded.  She states that about a month ago she fell and hit her head and never got checked out for it.  The patient denies SI.    Physical Exam   Triage Vital Signs: ED Triage Vitals  Enc Vitals Group     BP 05/24/22 1442 130/79     Pulse Rate 05/24/22 1442 (!) 105     Resp 05/24/22 1442 17     Temp 05/24/22 1442 98.4 F (36.9 C)     Temp Source 05/24/22 1442 Oral     SpO2 05/24/22 1442 99 %     Weight 05/24/22 1443 160 lb (72.6 kg)     Height 05/24/22 1443 5' 5"  (1.651 m)     Head Circumference --      Peak Flow --      Pain Score 05/24/22 1443 0     Pain Loc --      Pain Edu? --      Excl. in [place]? --     Most recent vital signs: Vitals:   05/24/22 1442 05/24/22 2011  BP: 130/79 133/69  Pulse: (!) 105 92  Resp: 17 18  Temp: 98.4 F (36.9 C) 98.4 F (36.9 C)  SpO2: 99% 97%     General: Awake, no distress.  CV:  Good peripheral perfusion.  Resp:  Normal effort.  Abd:  No distention.  Other:  EOMI.  PERRLA.  Motor intact in all extremities.  Normal coordination.  Calm and cooperative although intermittently tearful.   ED Results / Procedures / Treatments   Labs (all labs ordered are listed, but only abnormal results are displayed) Labs Reviewed  COMPREHENSIVE METABOLIC PANEL - Abnormal; Notable for the following components:      Result Value   Glucose, Bld 371 (*)    All other components within normal limits  SALICYLATE LEVEL - Abnormal;  Notable for the following components:   Salicylate Lvl <1.0 (*)    All other components within normal limits  ACETAMINOPHEN LEVEL - Abnormal; Notable for the following components:   Acetaminophen (Tylenol), Serum <10 (*)    All other components within normal limits  URINALYSIS, ROUTINE W REFLEX MICROSCOPIC - Abnormal; Notable for the following components:   Color, Urine YELLOW (*)    APPearance CLEAR (*)    Glucose, UA >=500 (*)    All other components within normal limits  CBG MONITORING, ED - Abnormal; Notable for the following components:   Glucose-Capillary 303 (*)    All other components within normal limits  RESP PANEL BY RT-PCR (FLU A&B, COVID) ARPGX2  ETHANOL  CBC  URINE DRUG SCREEN, QUALITATIVE ([facility] ONLY)     EKG     RADIOLOGY  CT head: I independently viewed and interpreted the images; there is no ICH.  Radiology report confirms no acute abnormality although cannot exclude NPH.  PROCEDURES:  Critical Care performed: No  Procedures   MEDICATIONS ORDERED IN ED: Medications  cholecalciferol (  VITAMIN D3) 25 MCG (1000 UNIT) tablet 25 mcg (25 mcg Oral Not Given 05/24/22 2108)  fenofibrate tablet 54 mg (has no administration in time range)  gabapentin (NEURONTIN) capsule 300 mg (300 mg Oral Not Given 05/24/22 2108)  lisinopril (ZESTRIL) tablet 10 mg (10 mg Oral Not Given 05/24/22 2108)  mesalamine (LIALDA) EC tablet 1.2 g (has no administration in time range)  mesalamine (LIALDA) EC tablet 1.2 g (1.2 g Oral Not Given 05/24/22 2109)  metFORMIN (GLUCOPHAGE) tablet 1,000 mg (has no administration in time range)  QUEtiapine (SEROQUEL) tablet 50 mg (50 mg Oral Given 05/24/22 2133)  topiramate (TOPAMAX) tablet 25 mg (25 mg Oral Given 05/24/22 2134)  venlafaxine XR (EFFEXOR-XR) 24 hr capsule 150 mg (has no administration in time range)  ascorbic acid (VITAMIN C) tablet 500 mg (500 mg Oral Not Given 05/24/22 2108)  vitamin E capsule 400 Units (has no administration in time  range)  metFORMIN (GLUCOPHAGE) tablet 1,000 mg (1,000 mg Oral Given 05/24/22 2133)     IMPRESSION / MDM / ASSESSMENT AND PLAN / ED COURSE  I reviewed the triage vital signs and the nursing notes.  61 year old female with PMH as noted above presents with what she reports to me is increased grief and depression.  She reported to the triage RN that she has been having some visual hallucinations.  She also reports a few recent falls and states she hit her head about a month ago and did not get checked out for it.  I the past medical records.  The patient was seen at the [facility] ED on 8/26 for small finger laceration.  She contacted her PMD Dr. Doy Hutching' office yesterday and today indicating being increasingly sleepy, hallucinating, and having multiple falls.  On exam the patient is well-appearing.  She is borderline tachycardic with otherwise normal vital signs.  Neurologic exam is nonfocal.  Differential diagnosis includes, but is not limited to, worsening depression, other mood disorder, psychosis, versus possible organic etiology such as intracranial hemorrhage, electrolyte abnormality, other metabolic disturbance, or UTI.  Patient's presentation is most consistent with acute presentation with potential threat to life or bodily function.  We will obtain CT head, lab work-up, and order psychiatry and TTS consults for further evaluation.  The patient has been placed in psychiatric observation due to the need to provide a safe environment for the patient while obtaining psychiatric consultation and evaluation, as well as ongoing medical and medication management to treat the patient's condition.  The patient has not been placed under full IVC at this time.   ----------------------------------------- 10:09 PM on 05/24/2022 -----------------------------------------  CT head shows no acute abnormality although the ventricles are increased in size which could indicate NPH.    Lab work-up is  unremarkable.  Electrolytes are normal.  There is no leukocytosis.  The patient has been evaluated by NP Grandville Silos from psychiatry and I consulted with her.  She advises that there is no indication for inpatient admission and the patient is appropriate for discharge home with outpatient follow-up.  There is no evidence of acute danger to self or others.  I counseled the patient on the results of the work-up including the CT findings and need for follow-up with her PMD.  I gave her thorough return precautions and she expressed understanding.  She is stable for discharge at this time.   FINAL CLINICAL IMPRESSION(S) / ED DIAGNOSES   Final diagnoses:  Depression, unspecified depression type     Rx / DC Orders   ED  Discharge Orders     None        Note:  This document was prepared using Dragon voice recognition software and may include unintentional dictation errors.    Arta Silence, MD 05/24/22 2210

## 2022-05-24 NOTE — ED Notes (Signed)
PT VOL/ PSYCH CONSULT ORDERED/ PENDING

## 2022-05-24 NOTE — ED Notes (Signed)
Hospital meal provided, pt tolerated w/o complaints.  Waste discarded appropriately.

## 2022-05-24 NOTE — ED Notes (Signed)
Report received from [place], Conservation officer, nature. Patient alert and oriented, warm and dry, and in no acute distress. Patient denies SI, HI, AH and pain. Patient made aware of Q15 minute rounds and Engineer, drilling presence for their safety. Patient instructed to come to this nurse with needs or concerns.

## 2022-05-24 NOTE — ED Triage Notes (Signed)
Pt from home via [facility] with reports that pt has been having intermittent visual hallucinations of her deceased granddaughter since December 08, 2022. Pt is A/O x 4 and knows that she is having hallucinations. Pt had a fall on 2022-12-08 and was seen at [facility].   110/70 110 97% 97.4 CBG 373

## 2022-05-24 NOTE — ED Triage Notes (Signed)
Pt states she fell at [facility] on Saturday and ever since then she has been seeing brick walls and tress where they are not suppose to be and seeing her granddaughter who passed away 20 years ago.

## 2022-05-24 NOTE — ED Notes (Signed)
Patient is being dressed out by this Probation officer, patient is provided with hospital attire and bag that is provided for belongings, bag 1 of 1, patient sticker placed on bag with also green sticker.   Belongings are: Necklaces were put inside personal purse [employer] and green socks White tanks  [employer] hooked onto purse

## 2022-05-24 NOTE — ED Notes (Signed)
Pt has contacted ride home, changing into street clothes now. Will be in [place] waiting for ride after DC

## 2022-05-24 NOTE — ED Notes (Signed)
Pt was offered a snack but declined.

## 2022-05-25 DIAGNOSIS — R441 Visual hallucinations: Secondary | ICD-10-CM | POA: Diagnosis present

## 2022-05-25 NOTE — Consult Note (Signed)
[place] Psychiatry Consult   Reason for Consult: Psychiatric Evaluation Referring Physician: Dr. Cherylann Banas Patient Identification: Lydia Foster MRN:  [id number] Principal Diagnosis: <principal problem not specified> Diagnosis:  Active Problems:   Ulcerative colitis with complication ([place])   Visual hallucination   Total Time spent with patient: 1 hour  Subjective: "I am ready to go home."  Lydia Foster is a 61 y.o. female patient presented to [facility] ED via [facility] voluntary. It was reported that the patient had been having intermittent visual hallucinations of her deceased granddaughter since 2022/12/22. The patient stated that the last time she had any visual hallucinations was this morning. The patient sustained a fall on 12/22/2022. The patient has had many losses and is currently not seeing a therapist. The patient shared that she has not been fortunate enough to find a therapist that she likes since the last two she had.  This provider saw The patient face-to-face; the chart was reviewed, and consulted with Dr.Siadecki on 05/24/2022 due to the patient's care. It was discussed with EDP that the patient does not meet the criteria to be admitted to the psychiatric inpatient unit.  On evaluation, the patient is alert and oriented x 4, calm, cooperative, and mood-congruent with affect. The patient does not appear to be responding to internal or external stimuli. Neither is the patient presenting with any delusional thinking. The patient denies auditory or visual hallucinations. The patient denies any suicidal, homicidal, or self-harm ideations. The patient is not presenting with any psychotic or paranoid behaviors. During an encounter with the patient, she could answer questions appropriately.  HPI: Per Dr. Cherylann Banas, Lydia Foster is a 61 y.o. female with a history of diabetes, anxiety, arthritis, migraine, and depression who presents with increased depressive thoughts, grief, and concern from  people who know her that she has been confused.  The patient states that she has also fallen a couple times although states she does not feel weak or lightheaded.  She states that about a month ago she fell and hit her head and never got checked out for it.  The patient denies SI.  Past Psychiatric History:  Anxiety Depression  Risk to Self:   Risk to Others:   Prior Inpatient Therapy:   Prior Outpatient Therapy:    Past Medical History:  Past Medical History:  Diagnosis Date   Anxiety    Colon polyps    Depression    Diabetes mellitus without complication ([place])    Legally blind     Past Surgical History:  Procedure Laterality Date   carpel tunnel     COLONOSCOPY     COLONOSCOPY WITH PROPOFOL N/A 04/16/2018   Procedure: COLONOSCOPY WITH PROPOFOL;  Surgeon: Manya Silvas, MD;  Location: [facility] ENDOSCOPY;  Service: Endoscopy;  Laterality: N/A;   TUBAL LIGATION     Family History:  Family History  Problem Relation Age of Onset   Breast cancer Maternal Aunt    Family Psychiatric  History:  Social History:  Social History   Substance and Sexual Activity  Alcohol Use Never     Social History   Substance and Sexual Activity  Drug Use Never    Social History   Socioeconomic History   Marital status: Legally Separated    Spouse name: Not on file   Number of children: Not on file   Years of education: Not on file   Highest education level: Not on file  Occupational History   Not on file  Tobacco Use  Smoking status: Former   Smokeless tobacco: Never  Scientific laboratory technician Use: Never used  Substance and Sexual Activity   Alcohol use: Never   Drug use: Never   Sexual activity: Not on file  Other Topics Concern   Not on file  Social History Narrative   Not on file   Social Determinants of Health   Financial Resource Strain: Not on file  Food Insecurity: Not on file  Transportation Needs: Not on file  Physical Activity: Not on file  Stress: Not on file   Social Connections: Not on file   Additional Social History:    Allergies:   Allergies  Allergen Reactions   Codeine Other (See Comments)   Hydrocodone-Acetaminophen Other (See Comments)   Sulfa Antibiotics Other (See Comments)   Tioconazole Other (See Comments)    worses symptoms   Ibuprofen Rash    Labs:  Results for orders placed or performed during the hospital encounter of 05/24/22 (from the past 48 hour(s))  Resp Panel by RT-PCR (Flu A&B, Covid) Anterior Nasal Swab     Status: None   Collection Time: 05/24/22  1:59 PM   Specimen: Anterior Nasal Swab  Result Value Ref Range   SARS Coronavirus 2 by RT PCR NEGATIVE NEGATIVE    Comment: (NOTE) SARS-CoV-2 target nucleic acids are NOT DETECTED.  The SARS-CoV-2 RNA is generally detectable in upper respiratory specimens during the acute phase of infection. The lowest concentration of SARS-CoV-2 viral copies this assay can detect is 138 copies/mL. A negative result does not preclude SARS-Cov-2 infection and should not be used as the sole basis for treatment or other patient management decisions. A negative result may occur with  improper specimen collection/handling, submission of specimen other than nasopharyngeal swab, presence of viral mutation(s) within the areas targeted by this assay, and inadequate number of viral copies(<138 copies/mL). A negative result must be combined with clinical observations, patient history, and epidemiological information. The expected result is Negative.  Fact Sheet for Patients:  EntrepreneurPulse.com.au  Fact Sheet for Healthcare Providers:  IncredibleEmployment.be  This test is no t yet approved or cleared by the [place] FDA and  has been authorized for detection and/or diagnosis of SARS-CoV-2 by FDA under an Emergency Use Authorization (EUA). This EUA will remain  in effect (meaning this test can be used) for the duration of the COVID-19  declaration under Section 564(b)(1) of the Act, 21 U.S.C.section 360bbb-3(b)(1), unless the authorization is terminated  or revoked sooner.       Influenza A by PCR NEGATIVE NEGATIVE   Influenza B by PCR NEGATIVE NEGATIVE    Comment: (NOTE) The Xpert Xpress SARS-CoV-2/FLU/RSV plus assay is intended as an aid in the diagnosis of influenza from Nasopharyngeal swab specimens and should not be used as a sole basis for treatment. Nasal washings and aspirates are unacceptable for Xpert Xpress SARS-CoV-2/FLU/RSV testing.  Fact Sheet for Patients: EntrepreneurPulse.com.au  Fact Sheet for Healthcare Providers: IncredibleEmployment.be  This test is not yet approved or cleared by the [place] FDA and has been authorized for detection and/or diagnosis of SARS-CoV-2 by FDA under an Emergency Use Authorization (EUA). This EUA will remain in effect (meaning this test can be used) for the duration of the COVID-19 declaration under Section 564(b)(1) of the Act, 21 U.S.C. section 360bbb-3(b)(1), unless the authorization is terminated or revoked.  Performed at [facility], [address], [place], [place] [address]   Comprehensive metabolic panel     Status: Abnormal  Collection Time: 05/24/22  3:01 PM  Result Value Ref Range   Sodium 139 135 - 145 mmol/L   Potassium 3.8 3.5 - 5.1 mmol/L   Chloride 107 98 - 111 mmol/L   CO2 24 22 - 32 mmol/L   Glucose, Bld 371 (H) 70 - 99 mg/dL    Comment: Glucose reference range applies only to samples taken after fasting for at least 8 hours.   BUN 11 8 - 23 mg/dL   Creatinine, Ser 0.82 0.44 - 1.00 mg/dL   Calcium 9.2 8.9 - 10.3 mg/dL   Total Protein 7.5 6.5 - 8.1 g/dL   Albumin 4.1 3.5 - 5.0 g/dL   AST 19 15 - 41 U/L   ALT 11 0 - 44 U/L   Alkaline Phosphatase 69 38 - 126 U/L   Total Bilirubin 0.6 0.3 - 1.2 mg/dL   GFR, Estimated >60 >60 mL/min    Comment: (NOTE) Calculated using the CKD-EPI  Creatinine Equation (2021)    Anion gap 8 5 - 15    Comment: Performed at [facility], [address] [place], [place] [address]  Ethanol     Status: None   Collection Time: 05/24/22  3:01 PM  Result Value Ref Range   Alcohol, Ethyl (B) <10 <10 mg/dL    Comment: (NOTE) Lowest detectable limit for serum alcohol is 10 mg/dL.  For medical purposes only. Performed at [facility], [address] [place], [place] [address]   Salicylate level     Status: Abnormal   Collection Time: 05/24/22  3:01 PM  Result Value Ref Range   Salicylate Lvl <1.2 (L) 7.0 - 30.0 mg/dL    Comment: Performed at [facility], [address] [place], [place] [address]  Acetaminophen level     Status: Abnormal   Collection Time: 05/24/22  3:01 PM  Result Value Ref Range   Acetaminophen (Tylenol), Serum <10 (L) 10 - 30 ug/mL    Comment: (NOTE) Therapeutic concentrations vary significantly. A range of 10-30 ug/mL  may be an effective concentration for many patients. However, some  are best treated at concentrations outside of this range. Acetaminophen concentrations >150 ug/mL at 4 hours after ingestion  and >50 ug/mL at 12 hours after ingestion are often associated with  toxic reactions.  Performed at [facility], [address] [place], [place] [address]   cbc     Status: None   Collection Time: 05/24/22  3:01 PM  Result Value Ref Range   WBC 4.7 4.0 - 10.5 K/uL   RBC 4.54 3.87 - 5.11 MIL/uL   Hemoglobin 12.8 12.0 - 15.0 g/dL   HCT 39.3 36.0 - 46.0 %   MCV 86.6 80.0 - 100.0 fL   MCH 28.2 26.0 - 34.0 pg   MCHC 32.6 30.0 - 36.0 g/dL   RDW 13.2 11.5 - 15.5 %   Platelets 238 150 - 400 K/uL   nRBC 0.0 0.0 - 0.2 %    Comment: Performed at [facility], [address] [place],  [address]  Urine Drug Screen, Qualitative     Status: None   Collection Time: 05/24/22  3:30 PM  Result Value Ref Range   Tricyclic, Ur Screen NONE DETECTED NONE  DETECTED   Amphetamines, Ur Screen NONE DETECTED NONE DETECTED   MDMA (Ecstasy)Ur Screen NONE DETECTED NONE DETECTED   Cocaine Metabolite,Ur [place] NONE DETECTED NONE DETECTED   Opiate, Ur Screen NONE DETECTED NONE DETECTED   Phencyclidine (PCP)  Ur S NONE DETECTED NONE DETECTED   Cannabinoid 50 Ng, Ur [place] NONE DETECTED NONE DETECTED   Barbiturates, Ur Screen NONE DETECTED NONE DETECTED   Benzodiazepine, Ur Scrn NONE DETECTED NONE DETECTED   Methadone Scn, Ur NONE DETECTED NONE DETECTED    Comment: (NOTE) Tricyclics + metabolites, urine    Cutoff 1000 ng/mL Amphetamines + metabolites, urine  Cutoff 1000 ng/mL MDMA (Ecstasy), urine              Cutoff 500 ng/mL Cocaine Metabolite, urine          Cutoff 300 ng/mL Opiate + metabolites, urine        Cutoff 300 ng/mL Phencyclidine (PCP), urine         Cutoff 25 ng/mL Cannabinoid, urine                 Cutoff 50 ng/mL Barbiturates + metabolites, urine  Cutoff 200 ng/mL Benzodiazepine, urine              Cutoff 200 ng/mL Methadone, urine                   Cutoff 300 ng/mL  The urine drug screen provides only a preliminary, unconfirmed analytical test result and should not be used for non-medical purposes. Clinical consideration and professional judgment should be applied to any positive drug screen result due to possible interfering substances. A more specific alternate chemical method must be used in order to obtain a confirmed analytical result. Gas chromatography / mass spectrometry (GC/MS) is the preferred confirm atory method. Performed at [facility], [address] [place], [place] [address]   Urinalysis, Routine w reflex microscopic     Status: Abnormal   Collection Time: 05/24/22  6:16 PM  Result Value Ref Range   Color, Urine YELLOW (A) YELLOW   APPearance CLEAR (A) CLEAR   Specific Gravity, Urine 1.030 1.005 - 1.030   pH 5.0 5.0 - 8.0   Glucose, UA >=500 (A) NEGATIVE mg/dL   Hgb urine dipstick NEGATIVE NEGATIVE    Bilirubin Urine NEGATIVE NEGATIVE   Ketones, ur NEGATIVE NEGATIVE mg/dL   Protein, ur NEGATIVE NEGATIVE mg/dL   Nitrite NEGATIVE NEGATIVE   Leukocytes,Ua NEGATIVE NEGATIVE   RBC / HPF 0-5 0 - 5 RBC/hpf   WBC, UA 0-5 0 - 5 WBC/hpf   Bacteria, UA NONE SEEN NONE SEEN   Squamous Epithelial / LPF NONE SEEN 0 - 5   Hyaline Casts, UA PRESENT     Comment: Performed at [facility], [address] [place], [place] [address]  CBG monitoring, ED     Status: Abnormal   Collection Time: 05/24/22  9:17 PM  Result Value Ref Range   Glucose-Capillary 303 (H) 70 - 99 mg/dL    Comment: Glucose reference range applies only to samples taken after fasting for at least 8 hours.   Comment 1 Notify RN    Comment 2 Document in Chart     Current Facility-Administered Medications  Medication Dose Route Frequency Provider Last Rate Last Admin   ascorbic acid (VITAMIN C) tablet 500 mg  500 mg Oral Daily Arta Silence, MD       cholecalciferol (VITAMIN D3) 25 MCG (1000 UNIT) tablet 25 mcg  25 mcg Oral Daily Arta Silence, MD       fenofibrate tablet 54 mg  54 mg Oral Daily Arta Silence, MD       gabapentin (NEURONTIN) capsule 300 mg  300 mg Oral Daily Siadecki,  Felix Ahmadi, MD       lisinopril (ZESTRIL) tablet 10 mg  10 mg Oral Daily Arta Silence, MD       mesalamine (LIALDA) EC tablet 1.2 g  1.2 g Oral Q breakfast Arta Silence, MD       mesalamine (LIALDA) EC tablet 1.2 g  1.2 g Oral QPM Arta Silence, MD       metFORMIN (GLUCOPHAGE) tablet 1,000 mg  1,000 mg Oral BID WC Arta Silence, MD       QUEtiapine (SEROQUEL) tablet 50 mg  50 mg Oral QHS Arta Silence, MD   50 mg at 05/24/22 2133   topiramate (TOPAMAX) tablet 25 mg  25 mg Oral QHS Arta Silence, MD   25 mg at 05/24/22 2134   venlafaxine XR (EFFEXOR-XR) 24 hr capsule 150 mg  150 mg Oral Q breakfast Arta Silence, MD       vitamin E capsule 400 Units  400 Units Oral Daily Arta Silence, MD       Current Outpatient Medications  Medication Sig Dispense Refill   cholecalciferol (VITAMIN D3) 25 MCG (1000 UNIT) tablet Take 1,000 Units by mouth daily.     fenofibrate (TRICOR) 48 MG tablet Take 48 mg by mouth daily.     gabapentin (NEURONTIN) 300 MG capsule Take 300 mg by mouth daily.     lisinopril (PRINIVIL,ZESTRIL) 10 MG tablet Take 10 mg by mouth daily.     mesalamine (LIALDA) 1.2 g EC tablet Take 1.2 g by mouth in the morning and at bedtime.     metFORMIN (GLUCOPHAGE) 1000 MG tablet Take 1,000 mg by mouth 2 (two) times daily with a meal.     Multiple Vitamin (MULTIVITAMIN) tablet Take 1 tablet by mouth daily.     QUEtiapine (SEROQUEL) 50 MG tablet Take 50 mg by mouth at bedtime.     topiramate (TOPAMAX) 25 MG tablet Take 25 mg by mouth at bedtime.     venlafaxine XR (EFFEXOR-XR) 150 MG 24 hr capsule Take 150 mg by mouth daily with breakfast.     vitamin C (ASCORBIC ACID) 500 MG tablet Take 500 mg by mouth daily.     vitamin E 400 UNIT capsule Take 400 Units by mouth daily.     amoxicillin-clavulanate (AUGMENTIN) 875-125 MG tablet Take 1 tablet by mouth 2 (two) times daily. (Patient not taking: Reported on 10/26/2018) 14 tablet 0   mupirocin ointment (BACTROBAN) 2 % Place 1 application into the nose 3 (three) times daily.     nystatin cream (MYCOSTATIN) Apply 1 application topically 2 (two) times daily.      Musculoskeletal: Strength & Muscle Tone: within normal limits Gait & Station: normal Patient leans: N/A  Psychiatric Specialty Exam:  Presentation  General Appearance: Appropriate for Environment  Eye Contact:Good  Speech:Clear and Coherent  Speech Volume:Normal  Handedness:Right   Mood and Affect  Mood:Euthymic  Affect:Appropriate   Thought Process  Thought Processes:Coherent  Descriptions of Associations:Intact  Orientation:Full (Time, Place and Person)  Thought Content:Logical  History of Schizophrenia/Schizoaffective disorder:No  data recorded Duration of Psychotic Symptoms:No data recorded Hallucinations:Hallucinations: None  Ideas of Reference:None  Suicidal Thoughts:Suicidal Thoughts: No  Homicidal Thoughts:Homicidal Thoughts: No   Sensorium  Memory:Immediate Good; Recent Good; Remote Good  Judgment:Good  Insight:Good   Executive Functions  Concentration:Good  Attention Span:Good  [place] of Knowledge:Good  Language:Good   Psychomotor Activity  Psychomotor Activity:Psychomotor Activity: Normal   Assets  Assets:Communication Skills; Desire for Improvement; Resilience; Social Support  Sleep  Sleep:Sleep: Good   Physical Exam: Physical Exam Vitals and nursing note reviewed.  Constitutional:      Appearance: Normal appearance. She is normal weight.  HENT:     Head: Normocephalic and atraumatic.     Right Ear: External ear normal.     Left Ear: External ear normal.     Nose: Nose normal.     Mouth/Throat:     Mouth: Mucous membranes are moist.  Cardiovascular:     Rate and Rhythm: Normal rate.     Pulses: Normal pulses.  Pulmonary:     Effort: Pulmonary effort is normal.  Musculoskeletal:        General: Normal range of motion.     Cervical back: Normal range of motion and neck supple.  Neurological:     General: No focal deficit present.     Mental Status: She is alert and oriented to person, place, and time.  Psychiatric:        Attention and Perception: Attention and perception normal.        Mood and Affect: Mood and affect normal.        Speech: Speech normal.        Behavior: Behavior normal. Behavior is cooperative.        Thought Content: Thought content normal.        Cognition and Memory: Cognition and memory normal.        Judgment: Judgment normal.    ROS Blood pressure 137/79, pulse 96, temperature 97.7 F (36.5 C), temperature source Oral, resp. rate 20, height 5' 5"  (1.651 m), weight 72.6 kg, SpO2 99 %. Body mass index is 26.63  kg/m.  Treatment Plan Summary: Plan The patient is not a safety risk to herself or others and does not require psychiatric inpatient admission for stabilization and treatment.  Disposition: No evidence of imminent risk to self or others at present.   Patient does not meet criteria for psychiatric inpatient admission. Supportive therapy provided about ongoing stressors. Refer to IOP. Discussed crisis plan, support from social network, calling 911, coming to the Emergency Department, and calling Suicide Hotline.  Caroline Sauger, NP 05/25/2022 1:35 AM

## 2022-06-02 ENCOUNTER — Other Ambulatory Visit: Payer: Self-pay | Admitting: Internal Medicine

## 2022-06-02 DIAGNOSIS — R1012 Left upper quadrant pain: Secondary | ICD-10-CM | POA: Diagnosis not present

## 2022-06-02 DIAGNOSIS — Z79899 Other long term (current) drug therapy: Secondary | ICD-10-CM | POA: Diagnosis not present

## 2022-06-02 DIAGNOSIS — I10 Essential (primary) hypertension: Secondary | ICD-10-CM | POA: Diagnosis not present

## 2022-06-02 DIAGNOSIS — E786 Lipoprotein deficiency: Secondary | ICD-10-CM | POA: Diagnosis not present

## 2022-06-02 DIAGNOSIS — F331 Major depressive disorder, recurrent, moderate: Secondary | ICD-10-CM | POA: Diagnosis not present

## 2022-06-02 DIAGNOSIS — E1165 Type 2 diabetes mellitus with hyperglycemia: Secondary | ICD-10-CM | POA: Diagnosis not present

## 2022-06-14 ENCOUNTER — Ambulatory Visit
Admission: RE | Admit: 2022-06-14 | Discharge: 2022-06-14 | Disposition: A | Discharge status: Home / Self Care | Payer: PPO | Source: Ambulatory Visit | Attending: Internal Medicine | Admitting: Internal Medicine

## 2022-06-14 DIAGNOSIS — R161 Splenomegaly, not elsewhere classified: Secondary | ICD-10-CM | POA: Diagnosis not present

## 2022-06-14 DIAGNOSIS — K828 Other specified diseases of gallbladder: Secondary | ICD-10-CM | POA: Diagnosis not present

## 2022-06-14 DIAGNOSIS — R1012 Left upper quadrant pain: Secondary | ICD-10-CM | POA: Diagnosis not present

## 2022-06-14 DIAGNOSIS — K760 Fatty (change of) liver, not elsewhere classified: Secondary | ICD-10-CM | POA: Diagnosis not present

## 2022-06-26 ENCOUNTER — Encounter: Payer: Self-pay | Admitting: Intensive Care

## 2022-06-26 ENCOUNTER — Emergency Department: Payer: PPO

## 2022-06-26 ENCOUNTER — Other Ambulatory Visit: Payer: Self-pay

## 2022-06-26 ENCOUNTER — Emergency Department
Admission: EM | Admit: 2022-06-26 | Discharge: 2022-06-26 | Disposition: A | Discharge status: Home / Self Care | Payer: PPO | Attending: Emergency Medicine | Admitting: Emergency Medicine

## 2022-06-26 DIAGNOSIS — R1012 Left upper quadrant pain: Secondary | ICD-10-CM | POA: Diagnosis not present

## 2022-06-26 DIAGNOSIS — R1084 Generalized abdominal pain: Secondary | ICD-10-CM | POA: Diagnosis not present

## 2022-06-26 DIAGNOSIS — R110 Nausea: Secondary | ICD-10-CM | POA: Insufficient documentation

## 2022-06-26 DIAGNOSIS — R109 Unspecified abdominal pain: Secondary | ICD-10-CM | POA: Diagnosis not present

## 2022-06-26 DIAGNOSIS — R1010 Upper abdominal pain, unspecified: Secondary | ICD-10-CM | POA: Diagnosis not present

## 2022-06-26 HISTORY — DX: Splenomegaly, not elsewhere classified: R16.1

## 2022-06-26 LAB — LIPASE, BLOOD: Lipase: 31 U/L (ref 11–51)

## 2022-06-26 LAB — CBC
HCT: 40.5 % (ref 36.0–46.0)
Hemoglobin: 13.4 g/dL (ref 12.0–15.0)
MCH: 28.4 pg (ref 26.0–34.0)
MCHC: 33.1 g/dL (ref 30.0–36.0)
MCV: 85.8 fL (ref 80.0–100.0)
Platelets: 288 10*3/uL (ref 150–400)
RBC: 4.72 MIL/uL (ref 3.87–5.11)
RDW: 13.2 % (ref 11.5–15.5)
WBC: 5.1 10*3/uL (ref 4.0–10.5)
nRBC: 0 % (ref 0.0–0.2)

## 2022-06-26 LAB — URINALYSIS, ROUTINE W REFLEX MICROSCOPIC
Bilirubin Urine: NEGATIVE
Glucose, UA: NEGATIVE mg/dL
Hgb urine dipstick: NEGATIVE
Ketones, ur: NEGATIVE mg/dL
Leukocytes,Ua: NEGATIVE
Nitrite: NEGATIVE
Protein, ur: NEGATIVE mg/dL
Specific Gravity, Urine: 1.035 — ABNORMAL HIGH (ref 1.005–1.030)
pH: 5 (ref 5.0–8.0)

## 2022-06-26 LAB — COMPREHENSIVE METABOLIC PANEL
ALT: 16 U/L (ref 0–44)
AST: 21 U/L (ref 15–41)
Albumin: 4.6 g/dL (ref 3.5–5.0)
Alkaline Phosphatase: 51 U/L (ref 38–126)
Anion gap: 8 (ref 5–15)
BUN: 12 mg/dL (ref 8–23)
CO2: 22 mmol/L (ref 22–32)
Calcium: 9.7 mg/dL (ref 8.9–10.3)
Chloride: 111 mmol/L (ref 98–111)
Creatinine, Ser: 0.91 mg/dL (ref 0.44–1.00)
GFR, Estimated: >60 mL/min (ref >60)
Glucose, Bld: 100 mg/dL — ABNORMAL HIGH (ref 70–99)
Potassium: 3.6 mmol/L (ref 3.5–5.1)
Sodium: 141 mmol/L (ref 135–145)
Total Bilirubin: 0.9 mg/dL (ref 0.3–1.2)
Total Protein: 8.2 g/dL — ABNORMAL HIGH (ref 6.5–8.1)

## 2022-06-26 MED ORDER — IOHEXOL 300 MG/ML  SOLN
100.0000 mL | Freq: Once | INTRAMUSCULAR | Status: AC | PRN
  Administered 2022-06-26: 100 mL via INTRAVENOUS

## 2022-06-26 MED ORDER — SUCRALFATE 1 G PO TABS: 1.0000 g | ORAL_TABLET | Freq: Four times a day (QID) | ORAL | 1 refills | Status: DC

## 2022-06-26 MED ORDER — FAMOTIDINE 20 MG PO TABS: 20.0000 mg | ORAL_TABLET | Freq: Every day | ORAL | 1 refills | Status: AC

## 2022-06-26 MED ORDER — ALUM & MAG HYDROXIDE-SIMETH 200-200-20 MG/5ML PO SUSP
30.0000 mL | Freq: Once | ORAL | Status: AC
  Administered 2022-06-26: 30 mL via ORAL
  Filled 2022-06-26: qty 30

## 2022-06-26 MED ORDER — FAMOTIDINE 20 MG PO TABS: 20.0000 mg | ORAL_TABLET | Freq: Every day | ORAL | 1 refills | Status: DC

## 2022-06-26 MED ORDER — LIDOCAINE VISCOUS HCL 2 % MT SOLN
15.0000 mL | Freq: Once | OROMUCOSAL | Status: AC
  Administered 2022-06-26: 15 mL via ORAL
  Filled 2022-06-26: qty 15

## 2022-06-26 NOTE — ED Triage Notes (Signed)
Patient reports "my spleen is enlarged and it is causing me pain." Has had recent US and PCP now wants her to have CT and she reports she cannot wait any longer for them to schedule because her pain is so bad. Reports nausea constantly.

## 2022-06-26 NOTE — ED Triage Notes (Signed)
Pt in via EMS from home with c/o left sided abd pain in upper quad. Pain radiates into left breast. Pt with enlarged spleen as well. 135/76, 99% RA. HR 86. Pain has gotten worse over the last 2 days and states that it has affected her bowels. CBG 112. Pt is legally blind

## 2022-06-26 NOTE — Discharge Instructions (Addendum)
Please seek medical attention for any high fevers, chest pain, shortness of breath, change in behavior, persistent vomiting, bloody stool or any other new or concerning symptoms.  

## 2022-06-26 NOTE — ED Provider Notes (Signed)
[facility] Provider Note    Event Date/Time   First MD Initiated Contact with Patient 06/26/22 1527     (approximate)   History   Abdominal Pain and Nausea   HPI  Lydia Foster is a 61 y.o. female  who presents to the emergency department today because of concern for left upper quadrant pain. The pain started a little over a month ago. Initially was intermittent but became constant starting yesterday. Described as burning and sharp. She has not noticed anything that brings the pain on or makes it worse. She has had associated nausea but no vomiting. No bloody stool. No fevers. Had US performed by PCP which showed mild splenomegaly.      Physical Exam   Triage Vital Signs: ED Triage Vitals  Enc Vitals Group     BP 06/26/22 1441 (!) 145/81     Pulse Rate 06/26/22 1441 84     Resp 06/26/22 1441 16     Temp 06/26/22 1441 98.4 F (36.9 C)     Temp Source 06/26/22 1441 Oral     SpO2 06/26/22 1441 98 %     Weight 06/26/22 1437 176 lb (79.8 kg)     Height 06/26/22 1437 5' 5"  (1.651 m)     Head Circumference --      Peak Flow --      Pain Score 06/26/22 1437 8     Pain Loc --      Pain Edu? --      Excl. in [place]? --     Most recent vital signs: Vitals:   06/26/22 1441  BP: (!) 145/81  Pulse: 84  Resp: 16  Temp: 98.4 F (36.9 C)  SpO2: 98%    General: Awake, alert, oriented. CV:  Good peripheral perfusion. Regular arte and rhythm. Resp:  Normal effort. Lungs clear Abd:  No distention. Minimally tender in the left upper quadrant.   ED Results / Procedures / Treatments   Labs (all labs ordered are listed, but only abnormal results are displayed) Labs Reviewed  COMPREHENSIVE METABOLIC PANEL - Abnormal; Notable for the following components:      Result Value   Glucose, Bld 100 (*)    Total Protein 8.2 (*)    All other components within normal limits  URINALYSIS, ROUTINE W REFLEX MICROSCOPIC - Abnormal; Notable for the following  components:   Color, Urine YELLOW (*)    APPearance CLEAR (*)    Specific Gravity, Urine 1.035 (*)    All other components within normal limits  LIPASE, BLOOD  CBC     EKG  None   RADIOLOGY I independently interpreted and visualized the CT abd/pel. My interpretation: No free air. Radiology interpretation:  IMPRESSION:  1. No acute abdominopelvic findings.  2. Spleen upper limits of normal/minimally enlarged. No change from  recent ultrasound.     PROCEDURES:  Critical Care performed: No  Procedures   MEDICATIONS ORDERED IN ED: Medications - No data to display   IMPRESSION / MDM / [place] / ED COURSE  I reviewed the triage vital signs and the nursing notes.                              Differential diagnosis includes, but is not limited to, gastritis, pancreatitis, cholecystitis.   Patient's presentation is most consistent with acute presentation with potential threat to life or bodily function.  Patient presented to the  emergency department today because of concerns for abdominal pain.  Located left upper quadrant.  Blood work without any concerning leukocytosis.  Lipase was normal.  No concerning electrolyte abnormality.  UA is not concerning for infection.  I did obtain a CT scan which did show slightly enlarged spleen however no other concerning findings.  She did feel some improvement after GI cocktail.  At this time I do think gastritis likely.  I discussed this with the patient.  Will plan on discharging with prescriptions and dietary guidelines.  FINAL CLINICAL IMPRESSION(S) / ED DIAGNOSES   Final diagnoses:  Generalized abdominal pain     Note:  This document was prepared using Dragon voice recognition software and may include unintentional dictation errors.    Nance Pear, MD 06/26/22 [phone]

## 2022-06-29 DIAGNOSIS — R1084 Generalized abdominal pain: Secondary | ICD-10-CM | POA: Diagnosis not present

## 2022-07-01 ENCOUNTER — Telehealth: Payer: Self-pay

## 2022-07-01 DIAGNOSIS — G9389 Other specified disorders of brain: Secondary | ICD-10-CM | POA: Diagnosis not present

## 2022-07-01 DIAGNOSIS — G43719 Chronic migraine without aura, intractable, without status migrainosus: Secondary | ICD-10-CM | POA: Diagnosis not present

## 2022-07-01 DIAGNOSIS — H3552 Pigmentary retinal dystrophy: Secondary | ICD-10-CM | POA: Diagnosis not present

## 2022-07-01 DIAGNOSIS — E1142 Type 2 diabetes mellitus with diabetic polyneuropathy: Secondary | ICD-10-CM | POA: Diagnosis not present

## 2022-07-01 DIAGNOSIS — F5101 Primary insomnia: Secondary | ICD-10-CM | POA: Diagnosis not present

## 2022-07-01 NOTE — Telephone Encounter (Signed)
      Patient  visited [facility] on 06/26/2022  for Abdominal pain  Telephone encounter attempt :  1st Attempt  A HIPAA compliant voice message was left requesting a return call.  Instructed patient to call back at [phone] at their earliest convenience.  [facility]  [place], [place]  Main Phone: [phone]  E-mail: [email or web address]  Website: [email or web address]

## 2022-07-04 ENCOUNTER — Telehealth: Payer: Self-pay

## 2022-07-04 NOTE — Telephone Encounter (Signed)
   Reason for call: ED-Follow up Visit  Patient  visit on 06/26/2022  at [facility] was for Abdominal Pain  Have you been able to follow up with your primary care physician? - Yes  The patient was or was not able to obtain any needed medicine or equipment. - Yes  Are there diet recommendations that you are having difficulty following? - No  Patient expresses understanding of discharge instructions and education provided has no other needs at this time.   [facility] management  [place], [place]  Main Phone: [phone]  E-mail: [email or web address]  Website: [email or web address]

## 2022-07-07 DIAGNOSIS — K5190 Ulcerative colitis, unspecified, without complications: Secondary | ICD-10-CM | POA: Diagnosis not present

## 2022-07-07 DIAGNOSIS — R161 Splenomegaly, not elsewhere classified: Secondary | ICD-10-CM | POA: Diagnosis not present

## 2022-07-07 DIAGNOSIS — R1310 Dysphagia, unspecified: Secondary | ICD-10-CM | POA: Diagnosis not present

## 2022-07-07 DIAGNOSIS — K219 Gastro-esophageal reflux disease without esophagitis: Secondary | ICD-10-CM | POA: Diagnosis not present

## 2022-07-07 DIAGNOSIS — R1084 Generalized abdominal pain: Secondary | ICD-10-CM | POA: Diagnosis not present

## 2022-07-14 ENCOUNTER — Other Ambulatory Visit: Payer: Self-pay | Admitting: Neurology

## 2022-07-14 DIAGNOSIS — G9389 Other specified disorders of brain: Secondary | ICD-10-CM

## 2022-07-14 DIAGNOSIS — G43719 Chronic migraine without aura, intractable, without status migrainosus: Secondary | ICD-10-CM

## 2022-07-21 DIAGNOSIS — H3552 Pigmentary retinal dystrophy: Secondary | ICD-10-CM | POA: Diagnosis not present

## 2022-07-21 DIAGNOSIS — R161 Splenomegaly, not elsewhere classified: Secondary | ICD-10-CM | POA: Diagnosis not present

## 2022-07-21 DIAGNOSIS — E119 Type 2 diabetes mellitus without complications: Secondary | ICD-10-CM | POA: Diagnosis not present

## 2022-07-21 DIAGNOSIS — H2513 Age-related nuclear cataract, bilateral: Secondary | ICD-10-CM | POA: Diagnosis not present

## 2022-07-25 ENCOUNTER — Ambulatory Visit
Admission: RE | Admit: 2022-07-25 | Discharge: 2022-07-25 | Disposition: A | Discharge status: Home / Self Care | Payer: PPO | Source: Ambulatory Visit | Attending: Neurology | Admitting: Neurology

## 2022-07-25 DIAGNOSIS — G43909 Migraine, unspecified, not intractable, without status migrainosus: Secondary | ICD-10-CM | POA: Diagnosis not present

## 2022-07-25 DIAGNOSIS — G9389 Other specified disorders of brain: Secondary | ICD-10-CM | POA: Insufficient documentation

## 2022-07-25 DIAGNOSIS — G43719 Chronic migraine without aura, intractable, without status migrainosus: Secondary | ICD-10-CM | POA: Diagnosis not present

## 2022-08-01 ENCOUNTER — Encounter: Payer: PPO | Admitting: Oncology

## 2022-08-01 ENCOUNTER — Other Ambulatory Visit: Payer: PPO

## 2022-08-09 ENCOUNTER — Inpatient Hospital Stay: Payer: PPO | Attending: Oncology | Admitting: Oncology

## 2022-08-09 ENCOUNTER — Inpatient Hospital Stay: Payer: PPO

## 2022-08-09 ENCOUNTER — Encounter: Payer: Self-pay | Admitting: Oncology

## 2022-08-09 VITALS — BP 125/77 | HR 86 | Temp 96.0°F | Wt 169.6 lb

## 2022-08-09 DIAGNOSIS — H548 Legal blindness, as defined in USA: Secondary | ICD-10-CM | POA: Insufficient documentation

## 2022-08-09 DIAGNOSIS — Z79899 Other long term (current) drug therapy: Secondary | ICD-10-CM | POA: Diagnosis not present

## 2022-08-09 DIAGNOSIS — R12 Heartburn: Secondary | ICD-10-CM | POA: Insufficient documentation

## 2022-08-09 DIAGNOSIS — Z7984 Long term (current) use of oral hypoglycemic drugs: Secondary | ICD-10-CM | POA: Insufficient documentation

## 2022-08-09 DIAGNOSIS — E119 Type 2 diabetes mellitus without complications: Secondary | ICD-10-CM | POA: Diagnosis not present

## 2022-08-09 DIAGNOSIS — R161 Splenomegaly, not elsewhere classified: Secondary | ICD-10-CM | POA: Diagnosis not present

## 2022-08-09 DIAGNOSIS — K5190 Ulcerative colitis, unspecified, without complications: Secondary | ICD-10-CM | POA: Insufficient documentation

## 2022-08-09 DIAGNOSIS — R768 Other specified abnormal immunological findings in serum: Secondary | ICD-10-CM | POA: Diagnosis not present

## 2022-08-09 DIAGNOSIS — K760 Fatty (change of) liver, not elsewhere classified: Secondary | ICD-10-CM | POA: Insufficient documentation

## 2022-08-09 DIAGNOSIS — Z803 Family history of malignant neoplasm of breast: Secondary | ICD-10-CM | POA: Diagnosis not present

## 2022-08-09 DIAGNOSIS — Z87891 Personal history of nicotine dependence: Secondary | ICD-10-CM | POA: Diagnosis not present

## 2022-08-09 DIAGNOSIS — R1310 Dysphagia, unspecified: Secondary | ICD-10-CM | POA: Insufficient documentation

## 2022-08-09 LAB — CBC WITH DIFFERENTIAL/PLATELET
Abs Immature Granulocytes: 0.08 10*3/uL — ABNORMAL HIGH (ref 0.00–0.07)
Basophils Absolute: 0 10*3/uL (ref 0.0–0.1)
Basophils Relative: 0 %
Eosinophils Absolute: 0 10*3/uL (ref 0.0–0.5)
Eosinophils Relative: 0 %
HCT: 39.6 % (ref 36.0–46.0)
Hemoglobin: 13.2 g/dL (ref 12.0–15.0)
Immature Granulocytes: 1 %
Lymphocytes Relative: 18 %
Lymphs Abs: 1.1 10*3/uL (ref 0.7–4.0)
MCH: 28.4 pg (ref 26.0–34.0)
MCHC: 33.3 g/dL (ref 30.0–36.0)
MCV: 85.3 fL (ref 80.0–100.0)
Monocytes Absolute: 0.3 10*3/uL (ref 0.1–1.0)
Monocytes Relative: 5 %
Neutro Abs: 4.4 10*3/uL (ref 1.7–7.7)
Neutrophils Relative %: 76 %
Platelets: 264 10*3/uL (ref 150–400)
RBC: 4.64 MIL/uL (ref 3.87–5.11)
RDW: 13.1 % (ref 11.5–15.5)
WBC: 5.9 10*3/uL (ref 4.0–10.5)
nRBC: 0 % (ref 0.0–0.2)

## 2022-08-09 LAB — COMPREHENSIVE METABOLIC PANEL
ALT: 16 U/L (ref 0–44)
AST: 20 U/L (ref 15–41)
Albumin: 4.3 g/dL (ref 3.5–5.0)
Alkaline Phosphatase: 72 U/L (ref 38–126)
Anion gap: 9 (ref 5–15)
BUN: 26 mg/dL — ABNORMAL HIGH (ref 8–23)
CO2: 26 mmol/L (ref 22–32)
Calcium: 9.5 mg/dL (ref 8.9–10.3)
Chloride: 101 mmol/L (ref 98–111)
Creatinine, Ser: 0.97 mg/dL (ref 0.44–1.00)
GFR, Estimated: >60 mL/min (ref >60)
Glucose, Bld: 267 mg/dL — ABNORMAL HIGH (ref 70–99)
Potassium: 4 mmol/L (ref 3.5–5.1)
Sodium: 136 mmol/L (ref 135–145)
Total Bilirubin: 0.6 mg/dL (ref 0.3–1.2)
Total Protein: 8 g/dL (ref 6.5–8.1)

## 2022-08-09 LAB — FERRITIN: Ferritin: 73 ng/mL (ref 11–307)

## 2022-08-09 LAB — IRON AND TIBC
Iron: 40 ug/dL (ref 28–170)
Saturation Ratios: 9 % — ABNORMAL LOW (ref 10.4–31.8)
TIBC: 434 ug/dL (ref 250–450)
UIBC: 394 ug/dL

## 2022-08-09 LAB — RETIC PANEL
Immature Retic Fract: 15.6 % (ref 2.3–15.9)
RBC.: 4.66 MIL/uL (ref 3.87–5.11)
Retic Count, Absolute: 123 10*3/uL (ref 19.0–186.0)
Retic Ct Pct: 2.6 % (ref 0.4–3.1)
Reticulocyte Hemoglobin: 34.3 pg (ref >27.9)

## 2022-08-09 LAB — HIV ANTIBODY (ROUTINE TESTING W REFLEX): HIV Screen 4th Generation wRfx: NONREACTIVE

## 2022-08-09 LAB — TECHNOLOGIST SMEAR REVIEW: Plt Morphology: NORMAL

## 2022-08-09 LAB — LACTATE DEHYDROGENASE: LDH: 127 U/L (ref 98–192)

## 2022-08-09 NOTE — Progress Notes (Signed)
Hematology/Oncology Consult note [phone] [phone]         Patient Care Team: Idelle Crouch, MD as PCP - General (Internal Medicine)  REFERRING PROVIDER: Allean Found*   CHIEF COMPLAINTS/REASON FOR VISIT:  Evaluation of splenomegaly  HISTORY OF PRESENTING ILLNESS:   Lydia Foster is a  61 y.o.  female with PMH listed below was seen in consultation at the request of  Allean Found*  for evaluation of splenomegaly  Patient has complained burning abdominal pain that last couple of minutes. 06/15/2022, ultrasound complete abdomen showed mild splenomegaly with splenic volume of 513 cm, 13.7 x 5.3 x 13.5 cm.  No focal abnormality.  Liver showed no focal lesion.  Heterogeneous and diffusely increased in parenchymal echogenicity, likely hepatic steatosis. Patient had an earlier ultrasound abdomen complete done on 10/22/2021, spleen size was 11.1 cm within normal limits.  06/26/2022, CT abdomen pelvis with contrast showed no acute abdominal pelvic findings.  Spleen platelets of normal/minimally enlarged.  13.3 x 14 x 4.8 cm.  Patient was seen by gastroenterology, she was tested negative for hepatitis B and C panel.  EBV IgG elevated at 596, EBV nuclear antigen antibody 121.  IgM less than 36.  Patient denies any recent virus infection, unintentional weight loss, night sweats, fever. Patient is legally blind. Occasional dysphagia/odynophagia to solid foods.  Heartburn.  Patient has ulcerative colitis and is on mesalamine.     MEDICAL HISTORY:  Past Medical History:  Diagnosis Date   Anxiety    Colon polyps    Depression    Diabetes mellitus without complication ([place])    Legally blind    Spleen enlarged     SURGICAL HISTORY: Past Surgical History:  Procedure Laterality Date   carpel tunnel     COLONOSCOPY     COLONOSCOPY WITH PROPOFOL N/A 04/16/2018   Procedure: COLONOSCOPY WITH PROPOFOL;  Surgeon: Manya Silvas, MD;   Location: [facility] ENDOSCOPY;  Service: Endoscopy;  Laterality: N/A;   TUBAL LIGATION      SOCIAL HISTORY: Social History   Socioeconomic History   Marital status: Legally Separated    Spouse name: Not on file   Number of children: Not on file   Years of education: Not on file   Highest education level: Not on file  Occupational History   Not on file  Tobacco Use   Smoking status: Former   Smokeless tobacco: Never  Vaping Use   Vaping Use: Never used  Substance and Sexual Activity   Alcohol use: Never   Drug use: Never   Sexual activity: Not on file  Other Topics Concern   Not on file  Social History Narrative   Not on file   Social Determinants of Health   Financial Resource Strain: Not on file  Food Insecurity: Not on file  Transportation Needs: Not on file  Physical Activity: Not on file  Stress: Not on file  Social Connections: Not on file  Intimate Partner Violence: Not on file    FAMILY HISTORY: Family History  Problem Relation Age of Onset   Stroke Mother    Hypertension Mother    Diabetes Mother    Diabetes Father    Diabetes Brother    Breast cancer Maternal Aunt    Stroke Paternal Aunt    Stroke Maternal Grandmother    Diabetes Maternal Grandmother    Diabetes Paternal Grandmother     ALLERGIES:  is allergic to codeine, hydrocodone-acetaminophen, sulfa antibiotics, tioconazole, and ibuprofen.  MEDICATIONS:  Current Outpatient Medications  Medication Sig Dispense Refill   Beta Carotene (VITAMIN A) 25000 UNIT capsule Take 25,000 Units by mouth daily.     cholecalciferol (VITAMIN D3) 25 MCG (1000 UNIT) tablet Take 1,000 Units by mouth daily.     cyanocobalamin (VITAMIN B12) 1000 MCG tablet Take 1,000 mcg by mouth daily.     famotidine (PEPCID) 20 MG tablet Take 1 tablet (20 mg total) by mouth daily. 30 tablet 1   fenofibrate (TRICOR) 48 MG tablet Take 48 mg by mouth daily.     lisinopril (PRINIVIL,ZESTRIL) 10 MG tablet Take 10 mg by mouth daily.      mesalamine (LIALDA) 1.2 g EC tablet Take 1.2 g by mouth in the morning and at bedtime.     Multiple Vitamin (MULTIVITAMIN) tablet Take 1 tablet by mouth daily.     mupirocin ointment (BACTROBAN) 2 % Place 1 application into the nose 3 (three) times daily.     nystatin cream (MYCOSTATIN) Apply 1 application topically 2 (two) times daily.     QUEtiapine (SEROQUEL) 50 MG tablet Take 50 mg by mouth at bedtime.     topiramate (TOPAMAX) 25 MG tablet Take 25 mg by mouth at bedtime.     venlafaxine XR (EFFEXOR-XR) 150 MG 24 hr capsule Take 150 mg by mouth daily with breakfast.     vitamin C (ASCORBIC ACID) 500 MG tablet Take 500 mg by mouth daily.     vitamin E 400 UNIT capsule Take 400 Units by mouth daily.     gabapentin (NEURONTIN) 300 MG capsule Take 300 mg by mouth daily. (Patient not taking: Reported on 08/09/2022)     metFORMIN (GLUCOPHAGE) 1000 MG tablet Take 1,000 mg by mouth 2 (two) times daily with a meal. (Patient not taking: Reported on 08/09/2022)     sucralfate (CARAFATE) 1 g tablet Take 1 tablet (1 g total) by mouth 4 (four) times daily. (Patient not taking: Reported on 08/09/2022) 120 tablet 1   No current facility-administered medications for this visit.    Review of Systems  Constitutional:  Negative for appetite change, chills, fatigue and fever.  HENT:   Negative for hearing loss and voice change.   Eyes:  Negative for eye problems.  Respiratory:  Negative for chest tightness and cough.   Cardiovascular:  Negative for chest pain.  Gastrointestinal:  Negative for abdominal distention, abdominal pain, blood in stool and nausea.  Endocrine: Negative for hot flashes.  Genitourinary:  Negative for difficulty urinating and frequency.   Musculoskeletal:  Negative for arthralgias.  Skin:  Negative for itching and rash.  Neurological:  Negative for extremity weakness.  Hematological:  Negative for adenopathy.  Psychiatric/Behavioral:  Negative for confusion.    PHYSICAL  EXAMINATION: ECOG PERFORMANCE STATUS: 1 - Symptomatic but completely ambulatory Vitals:   08/09/22 1101  BP: 125/77  Pulse: 86  Temp: (!) 96 F (35.6 C)  SpO2: 98%   Filed Weights   08/09/22 1101  Weight: 169 lb 9.6 oz (76.9 kg)    Physical Exam Constitutional:      General: She is not in acute distress. HENT:     Head: Normocephalic and atraumatic.  Eyes:     General: No scleral icterus. Cardiovascular:     Rate and Rhythm: Normal rate.  Pulmonary:     Effort: Pulmonary effort is normal. No respiratory distress.  Abdominal:     General: Bowel sounds are normal. There is no distension.  Musculoskeletal:  General: No deformity. Normal range of motion.     Cervical back: Normal range of motion and neck supple.  Skin:    General: Skin is warm and dry.     Findings: No erythema or rash.  Neurological:     Mental Status: She is alert and oriented to person, place, and time. Mental status is at baseline.     Cranial Nerves: No cranial nerve deficit.     Coordination: Coordination normal.  Psychiatric:        Mood and Affect: Mood normal.     LABORATORY DATA:  I have reviewed the data as listed    Latest Ref Rng & Units 08/09/2022   11:51 AM 06/26/2022    2:45 PM 05/24/2022    3:01 PM  CBC  WBC 4.0 - 10.5 K/uL 5.9  5.1  4.7   Hemoglobin 12.0 - 15.0 g/dL 13.2  13.4  12.8   Hematocrit 36.0 - 46.0 % 39.6  40.5  39.3   Platelets 150 - 400 K/uL 264  288  238       Latest Ref Rng & Units 08/09/2022   11:51 AM 06/26/2022    2:45 PM 05/24/2022    3:01 PM  CMP  Glucose 70 - 99 mg/dL 267  100  371   BUN 8 - 23 mg/dL _0 Creatinine 0.44 - 1.00 mg/dL 0.97  0.91  0.82   Sodium 135 - 145 mmol/L 136  141  139   Potassium 3.5 - 5.1 mmol/L 4.0  3.6  3.8   Chloride 98 - 111 mmol/L 101  111  107   CO2 22 - 32 mmol/L _1 Calcium 8.9 - 10.3 mg/dL 9.5  9.7  9.2   Total Protein 6.5 - 8.1 g/dL 8.0  8.2  7.5   Total Bilirubin 0.3 - 1.2 mg/dL 0.6  0.9  0.6    Alkaline Phos 38 - 126 U/L 72  51  69   AST 15 - 41 U/L _2 ALT 0 - 44 U/L _3 RADIOGRAPHIC STUDIES: I have personally reviewed the radiological images as listed and agreed with the findings in the report. MR BRAIN WO CONTRAST  Result Date: 07/27/2022 CLINICAL DATA:  Ventriculomegaly.  Migraines. EXAM: MRI HEAD WITHOUT CONTRAST TECHNIQUE: Multiplanar, multiecho pulse sequences of the brain and surrounding structures were obtained without intravenous contrast. COMPARISON:  Head CT 05/24/2022 and MRI 12/31/2013 FINDINGS: Brain: There is no evidence of an acute infarct, intracranial hemorrhage, mass, midline shift, or extra-axial fluid collection. 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 Moderate lateral ventriculomegaly has progressed from the prior MRI with greatest dilatation of the atria and occipital horns. The temporal horns are mildly dilated, as is the third ventricle. The sylvian fissures are dilated, while the cerebral sulci are otherwise not significantly enlarged. Minimal T2 FLAIR hyperintensities in the periventricular white matter are nonspecific but may reflect chronic small vessel ischemia. No significant transependymal CSF flow is evident. The cerebellar tonsils are normally positioned. Vascular: Major intracranial vascular flow voids are preserved. Skull and upper cervical spine: Unremarkable bone marrow signal. Sinuses/Orbits: Unremarkable orbits. Paranasal sinuses and mastoid air cells are clear. Other: None. IMPRESSION: 1. No acute intracranial abnormality. 2. Chronic ventriculomegaly which has progressed from 2015, indeterminate for central predominant cerebral atrophy versus normal pressure hydrocephalus.  3. 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 Electronically Signed   By: Logan Bores M.D.   On: 07/27/2022 15:37   CT ABDOMEN PELVIS W CONTRAST  Result Date: 06/26/2022 CLINICAL DATA:  LEFT-sided  abdominal pain enlarged spleen. EXAM: CT ABDOMEN AND PELVIS WITH CONTRAST TECHNIQUE: Multidetector CT imaging of the abdomen and pelvis was performed using the standard protocol following bolus administration of intravenous contrast. RADIATION DOSE REDUCTION: This exam was performed according to the departmental dose-optimization program which includes automated exposure control, adjustment of the mA and/or kV according to patient size and/or use of iterative reconstruction technique. CONTRAST:  120m OMNIPAQUE IOHEXOL 300 MG/ML  SOLN COMPARISON:  Ultrasound 06/14/2022 FINDINGS: Lower chest: Lung bases are clear. Hepatobiliary: No focal hepatic lesion. No biliary duct dilatation. Common bile duct is normal. Pancreas: Pancreas is normal. No ductal dilatation. No pancreatic inflammation. Spleen: Spleen minimally enlarged/upper limits normal measuring 13.3 by 14.0 x 4.8 cm (volume = 470 cm^3). This is similar to recent ultrasound (volume equal 513 cc) Adrenals/urinary tract: Adrenal glands and kidneys are normal. The ureters and bladder normal. Stomach/Bowel: Stomach, small bowel, appendix, and cecum are normal. The colon and rectosigmoid colon are normal. Vascular/Lymphatic: Abdominal aorta is normal caliber. No periportal or retroperitoneal adenopathy. No pelvic adenopathy. Reproductive: Uterus and adnexa unremarkable. Other: No free fluid. Musculoskeletal: No aggressive osseous lesion. IMPRESSION: 1. No acute abdominopelvic findings. 2. Spleen upper limits of normal/minimally enlarged. No change from recent ultrasound. Electronically Signed   By: SSuzy BouchardM.D.   On: 06/26/2022 17:12   [place]domen Complete  Result Date: 06/15/2022 CLINICAL DATA:  Left upper quadrant pain. EXAM: ABDOMEN ULTRASOUND COMPLETE COMPARISON:  Abdominal ultrasound 10/22/2021.  CT 01/30/2015 FINDINGS: Gallbladder: Physiologically distended. No gallstones or wall thickening visualized. No sonographic Murphy sign noted by sonographer.  Common bile duct: Diameter: 3 mm. Liver: No focal lesion identified. Heterogeneous and diffusely increased in parenchymal echogenicity. No capsular nodularity portal vein is patent on color Doppler imaging with normal direction of blood flow towards the liver. IVC: No abnormality visualized. Pancreas: Visualized portion unremarkable. Spleen: Enlarged measuring 13.7 x 5.3 x 13.5 cm. Volume 513 cc. No focal abnormality. Right Kidney: Length: 10.1 cm. Normal parenchymal echogenicity. No hydronephrosis. No visualized stone or focal lesion. Left Kidney: Length: 9.8 cm. Normal parenchymal echogenicity. No hydronephrosis. No visualized stone or focal lesion. Abdominal aorta: No aneurysm visualized. Other findings: No abdominal ascites. IMPRESSION: 1. Mild splenomegaly with splenic volume of 513 cc, new from prior exam. 2. Hepatic steatosis. Electronically Signed   By: MKeith RakeM.D.   On: 06/15/2022 21:24   CT Head Wo Contrast  Result Date: 05/24/2022 CLINICAL DATA:  Pt from home via [facility] with reports that pt has been having intermittent visual hallucinations of her deceased granddaughter since [id number] Head trauma, moderate-severe EXAM: CT HEAD WITHOUT CONTRAST TECHNIQUE: Contiguous axial images were obtained from the base of the skull through the vertex without intravenous contrast. RADIATION DOSE REDUCTION: This exam was performed according to the departmental dose-optimization program which includes automated exposure control, adjustment of the mA and/or kV according to patient size and/or use of iterative reconstruction technique. COMPARISON:  CT head 09/18/2015 BRAIN: BRAIN Stable prominence of the lateral ventricles may be related to central predominant atrophy, although a component of normal pressure/communicating hydrocephalus cannot be excluded. Patchy and confluent areas of decreased attenuation are noted throughout the deep and periventricular white matter of the cerebral hemispheres bilaterally,  compatible with chronic microvascular ischemic disease. No evidence of large-territorial  acute infarction. No parenchymal hemorrhage. No mass lesion. No extra-axial collection. No mass effect or midline shift. No hydrocephalus. Basilar cisterns are patent. Vascular: No hyperdense vessel. Skull: No acute fracture or focal lesion. Sinuses/Orbits: Paranasal sinuses and mastoid air cells are clear. The orbits are unremarkable. Other: None. IMPRESSION: 1. No acute intracranial abnormality. 2. Stable prominence of the lateral ventricles may be related to central predominant atrophy, although a component of normal pressure/communicating hydrocephalus cannot be excluded. Electronically Signed   By: Iven Finn M.D.   On: 05/24/2022 17:59       ASSESSMENT & PLAN:   Splenomegaly Splenomegaly, new since January 2023.  Reactive versus other etiologies. Check CBC, smear, reticulocyte panel, peripheral blood flow cytometry, multiple myeloma panel, light chain ratio, ANA, HIV, LDH, EBV DNA by PCR, CMV DNA by PCR, ferritin, iron TIBC, LDH, CMP,  Fatty liver disease, nonalcoholic Liver function is normal.  Check iron TIBC ferritin. Advised healthy diet   Orders Placed This Encounter  Procedures   CBC with Differential/Platelet    Standing Status:   Future    Number of Occurrences:   1    Standing Expiration Date:   08/10/2023   Retic Panel    Standing Status:   Future    Number of Occurrences:   1    Standing Expiration Date:   08/10/2023   Flow cytometry panel-leukemia/lymphoma work-up    Standing Status:   Future    Number of Occurrences:   1    Standing Expiration Date:   08/10/2023   Kappa/lambda light chains    Standing Status:   Future    Number of Occurrences:   1    Standing Expiration Date:   08/10/2023   Multiple Myeloma Panel (SPEP&IFE w/QIG)    Standing Status:   Future    Number of Occurrences:   1    Standing Expiration Date:   08/10/2023   ANA, IFA (with reflex)    Standing  Status:   Future    Number of Occurrences:   1    Standing Expiration Date:   08/10/2023   HIV Antibody (routine testing w rflx)    Standing Status:   Future    Number of Occurrences:   1    Standing Expiration Date:   08/10/2023   Lactate dehydrogenase    Standing Status:   Future    Number of Occurrences:   1    Standing Expiration Date:   08/10/2023   JAK2 [id number] rfx CALR/MPL/E12-15    Standing Status:   Future    Number of Occurrences:   1    Standing Expiration Date:   08/10/2023   Comprehensive metabolic panel    Standing Status:   Future    Number of Occurrences:   1    Standing Expiration Date:   08/10/2023   Epstein barr vrs(ebv dna by pcr)    Standing Status:   Future    Number of Occurrences:   1    Standing Expiration Date:   08/10/2023   CMV DNA, quantitative, PCR    Standing Status:   Future    Number of Occurrences:   1    Standing Expiration Date:   08/10/2023   Ferritin    Standing Status:   Future    Number of Occurrences:   1    Standing Expiration Date:   02/07/2023   Iron and TIBC    Standing Status:   Future    Number  of Occurrences:   1    Standing Expiration Date:   08/10/2023   Technologist smear review    Standing Status:   Future    Number of Occurrences:   1    Standing Expiration Date:   08/10/2023    Order Specific Question:   Clinical information:    Answer:   splenomegly, please check if any tear drop cells.    All questions were answered. The patient knows to call the clinic with any problems, questions or concerns.  [place], [place]*   Return of visit: 3 to 4 weeks to go over results. Thank you for this kind referral and the opportunity to participate in the care of this patient. A copy of today's note is routed to referring provider   Earlie Server, MD, PhD [facility] Hematology Oncology 08/09/2022

## 2022-08-09 NOTE — Assessment & Plan Note (Signed)
Splenomegaly, new since January 2023.  Reactive versus other etiologies. Check CBC, smear, reticulocyte panel, peripheral blood flow cytometry, multiple myeloma panel, light chain ratio, ANA, HIV, LDH, EBV DNA by PCR, CMV DNA by PCR, ferritin, iron TIBC, LDH, CMP,

## 2022-08-09 NOTE — Assessment & Plan Note (Signed)
Liver function is normal.  Check iron TIBC ferritin. Advised healthy diet

## 2022-08-10 LAB — KAPPA/LAMBDA LIGHT CHAINS
Kappa free light chain: 29.6 mg/L — ABNORMAL HIGH (ref 3.3–19.4)
Kappa, lambda light chain ratio: 1.17 (ref 0.26–1.65)
Lambda free light chains: 25.2 mg/L (ref 5.7–26.3)

## 2022-08-11 LAB — COMP PANEL: LEUKEMIA/LYMPHOMA

## 2022-08-11 LAB — ANTINUCLEAR ANTIBODIES, IFA: ANA Ab, IFA: POSITIVE — AB

## 2022-08-11 LAB — MULTIPLE MYELOMA PANEL, SERUM
Albumin SerPl Elph-Mcnc: 4.1 g/dL (ref 2.9–4.4)
Albumin/Glob SerPl: 1.2 (ref 0.7–1.7)
Alpha 1: 0.2 g/dL (ref 0.0–0.4)
Alpha2 Glob SerPl Elph-Mcnc: 0.8 g/dL (ref 0.4–1.0)
B-Globulin SerPl Elph-Mcnc: 1.1 g/dL (ref 0.7–1.3)
Gamma Glob SerPl Elph-Mcnc: 1.3 g/dL (ref 0.4–1.8)
Globulin, Total: 3.5 g/dL (ref 2.2–3.9)
IgA: 368 mg/dL — ABNORMAL HIGH (ref 87–352)
IgG (Immunoglobin G), Serum: 1241 mg/dL (ref 586–1602)
IgM (Immunoglobulin M), Srm: 117 mg/dL (ref 26–217)
Total Protein ELP: 7.6 g/dL (ref 6.0–8.5)

## 2022-08-11 LAB — FANA STAINING PATTERNS: Homogeneous Pattern: 1 — ABNORMAL HIGH

## 2022-08-11 LAB — EPSTEIN BARR VRS(EBV DNA BY PCR)
EBV DNA QN by PCR: 174 IU/mL
log10 EBV DNA Qn PCR: 2.241 log10 IU/mL

## 2022-08-11 LAB — CMV DNA, QUANTITATIVE, PCR
CMV DNA Quant: NEGATIVE IU/mL
Log10 CMV Qn DNA Pl: UNDETERMINED log10 IU/mL

## 2022-08-16 LAB — CALR +MPL + E12-E15  (REFLEX)

## 2022-08-16 LAB — JAK2 V617F RFX CALR/MPL/E12-15

## 2022-09-05 DIAGNOSIS — F5105 Insomnia due to other mental disorder: Secondary | ICD-10-CM | POA: Diagnosis not present

## 2022-09-05 DIAGNOSIS — F331 Major depressive disorder, recurrent, moderate: Secondary | ICD-10-CM | POA: Diagnosis not present

## 2022-09-05 DIAGNOSIS — Z79899 Other long term (current) drug therapy: Secondary | ICD-10-CM | POA: Diagnosis not present

## 2022-09-05 DIAGNOSIS — I10 Essential (primary) hypertension: Secondary | ICD-10-CM | POA: Diagnosis not present

## 2022-09-05 DIAGNOSIS — E782 Mixed hyperlipidemia: Secondary | ICD-10-CM | POA: Diagnosis not present

## 2022-09-05 DIAGNOSIS — F330 Major depressive disorder, recurrent, mild: Secondary | ICD-10-CM | POA: Diagnosis not present

## 2022-09-05 DIAGNOSIS — E1165 Type 2 diabetes mellitus with hyperglycemia: Secondary | ICD-10-CM | POA: Diagnosis not present

## 2022-09-05 DIAGNOSIS — Z0000 Encounter for general adult medical examination without abnormal findings: Secondary | ICD-10-CM | POA: Diagnosis not present

## 2022-09-05 DIAGNOSIS — F419 Anxiety disorder, unspecified: Secondary | ICD-10-CM | POA: Diagnosis not present

## 2022-09-11 IMAGING — US US ABDOMEN COMPLETE
1 series · 15 of 25 positions shown · non-contrast
Comparison: None.

CLINICAL DATA: Upper abdominal pain x3 months.

EXAM:
ABDOMEN ULTRASOUND COMPLETE

[Series 1: us abdomen complete · 15 of 89 slices shown]
[im 1/89]
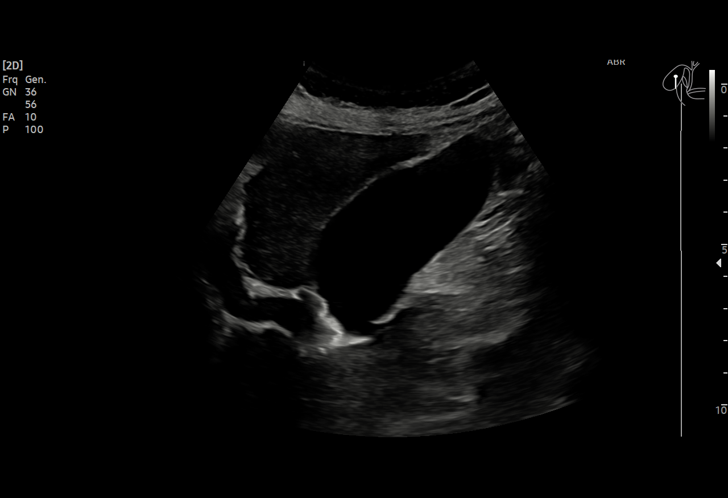
[im 8/89]
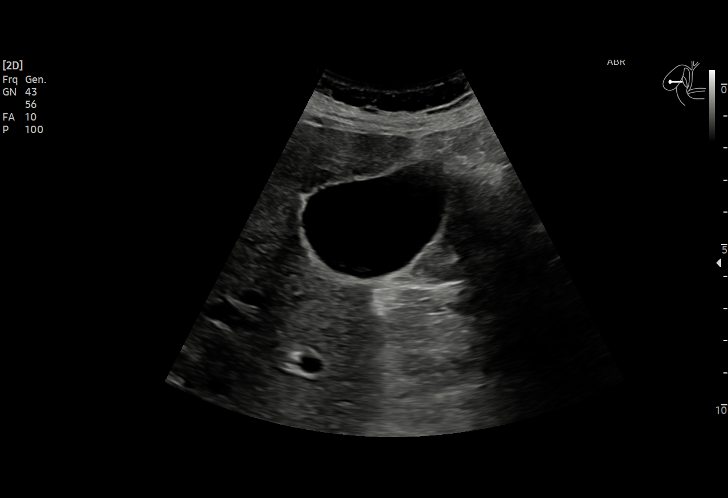
[im 15/89]
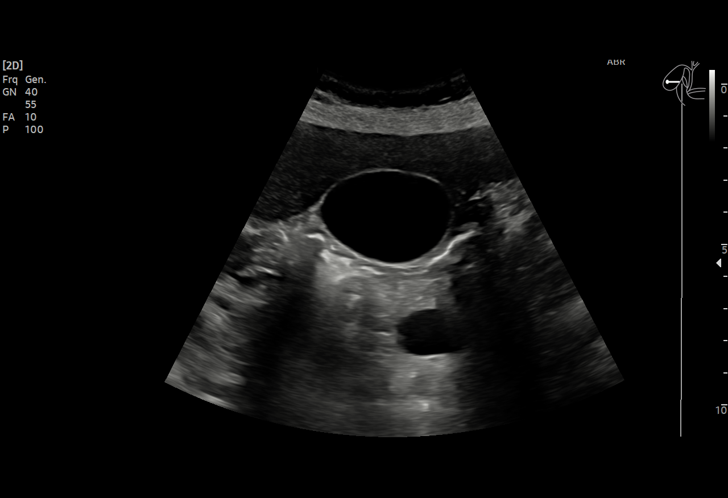
[im 19/89]
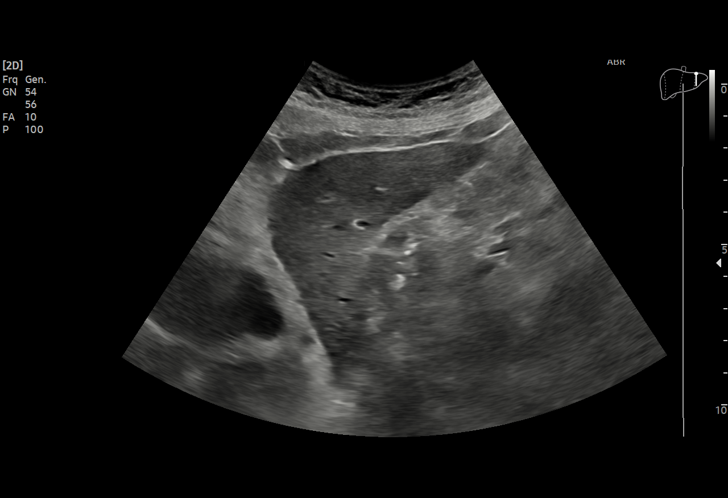
[im 26/89]
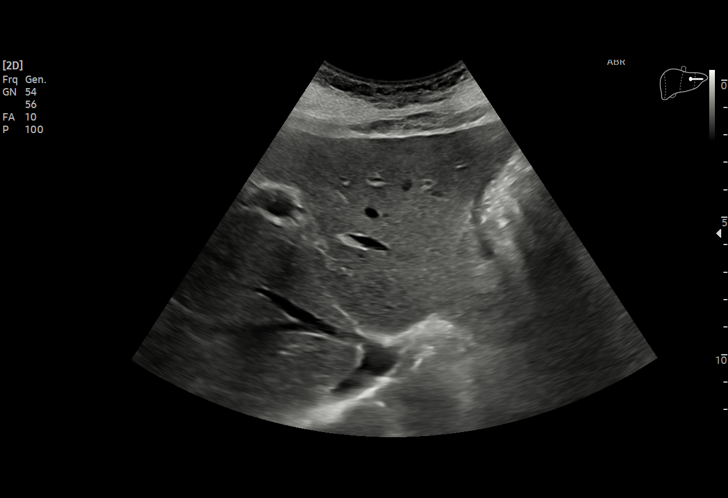
[im 34/89]
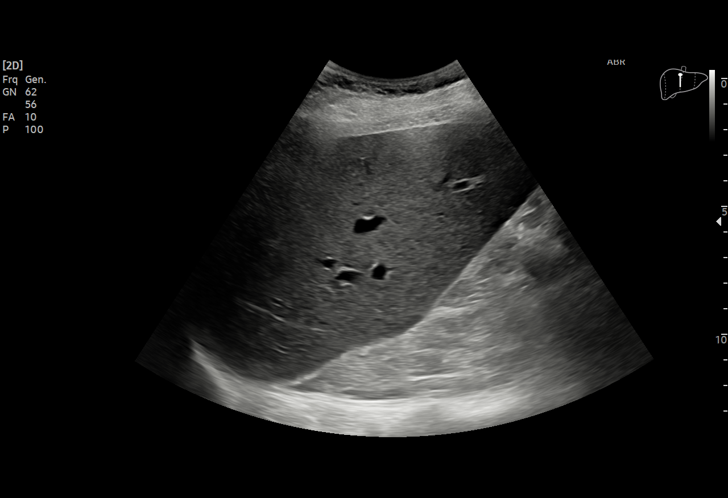
[im 37/89]
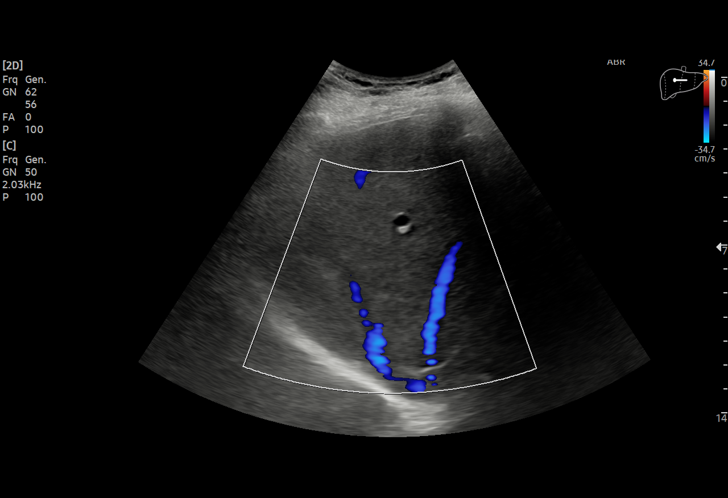
[im 45/89]
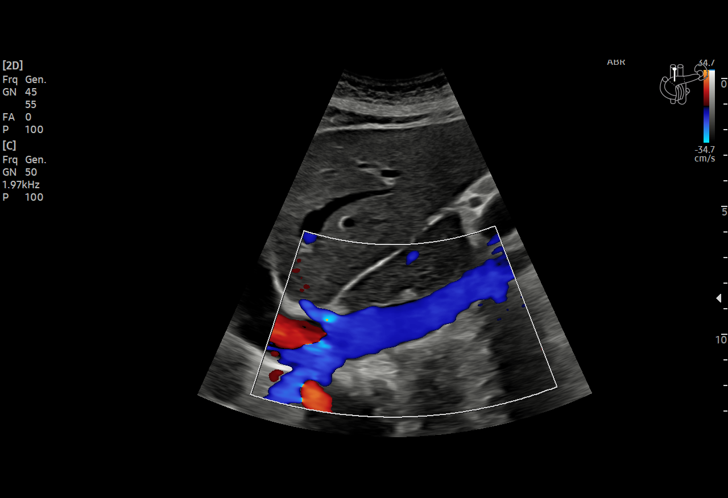
[im 52/89]
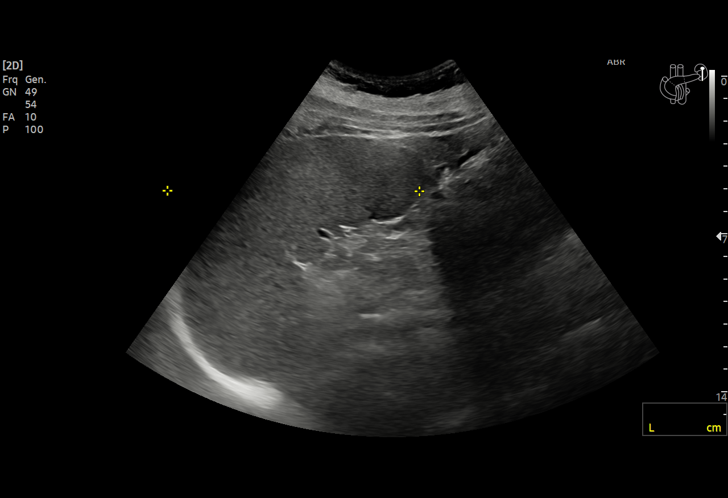
[im 56/89]
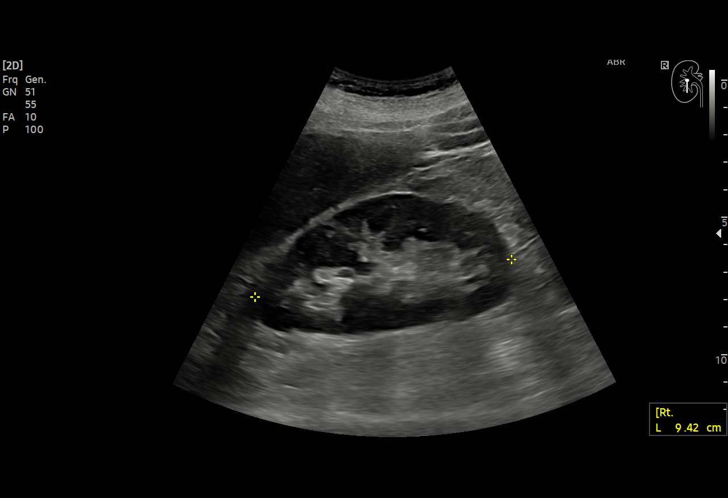
[im 63/89]
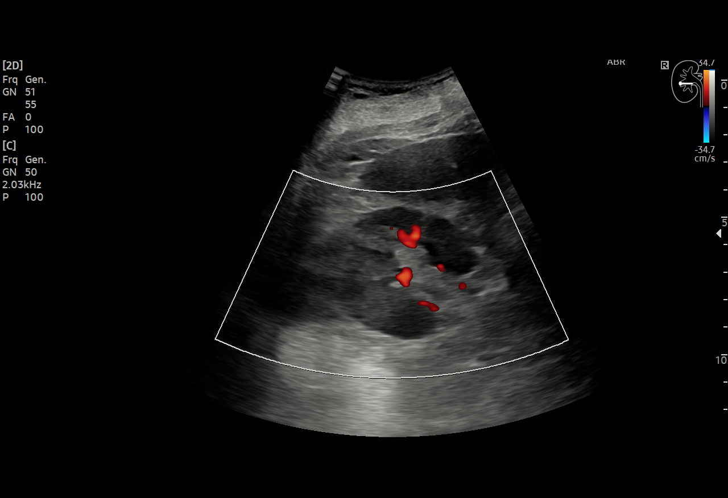
[im 70/89]
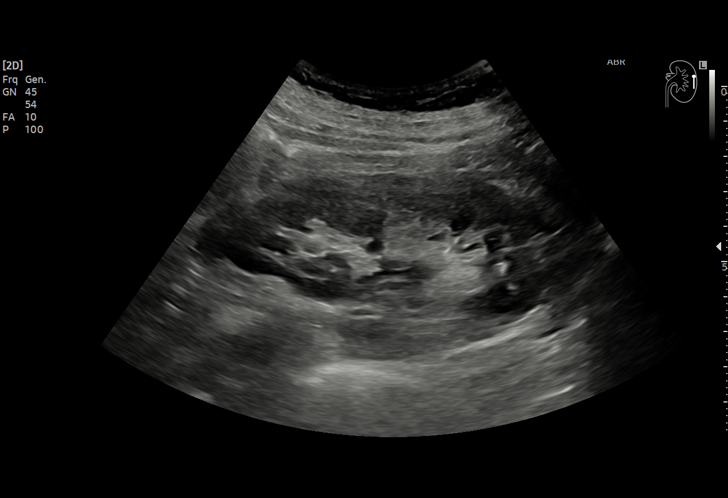
[im 74/89]
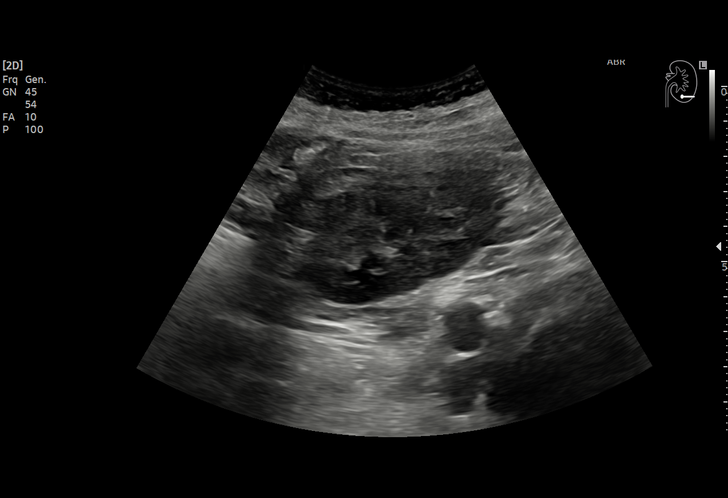
[im 81/89]
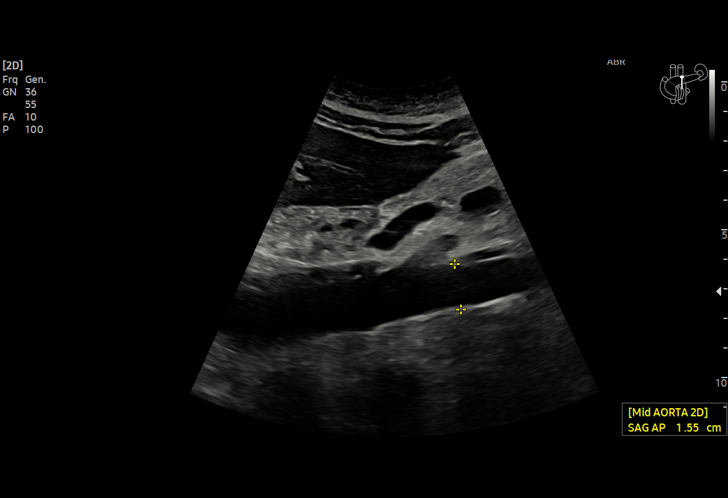
[im 89/89]
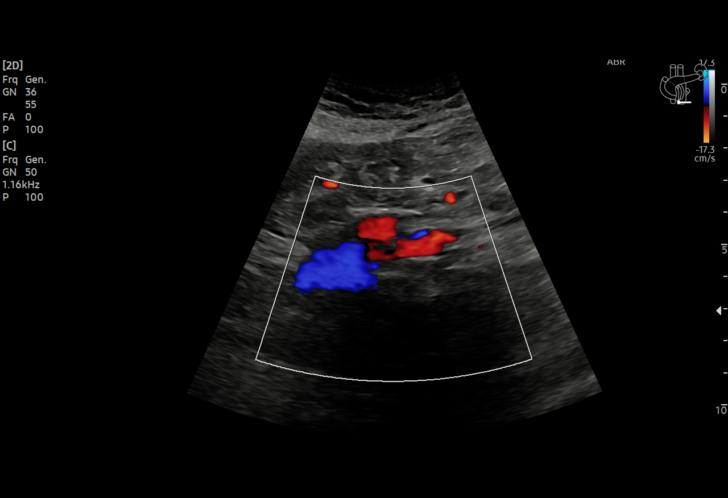

[15 of 25 positions shown; findings below may reference images not displayed]

FINDINGS: Gallbladder: No gallstones or wall thickening visualized (1.5 mm).
No sonographic Murphy sign noted by sonographer.

Common bile duct: Diameter: 3.7 mm

Liver: No focal lesion identified. Within normal limits in
parenchymal echogenicity. Portal vein is patent on color Doppler
imaging with normal direction of blood flow towards the liver.

IVC: No abnormality visualized.

Pancreas: Visualized portion unremarkable.

Spleen: Size (11.1 cm) and appearance within normal limits.

Right Kidney: Length: 9.4 cm. Echogenicity within normal limits. No
mass or hydronephrosis visualized.

Left Kidney: Length: 9.4 cm. Echogenicity within normal limits. No
mass or hydronephrosis visualized.

Abdominal aorta: No aneurysm visualized (2.1 cm in AP diameter).

Other findings: None.
IMPRESSION: Normal abdominal ultrasound.

## 2022-09-13 ENCOUNTER — Encounter: Payer: Self-pay | Admitting: Oncology

## 2022-09-13 ENCOUNTER — Inpatient Hospital Stay: Payer: PPO | Attending: Oncology | Admitting: Oncology

## 2022-09-13 VITALS — BP 149/75 | HR 96 | Temp 97.6°F | Wt 177.7 lb

## 2022-09-13 DIAGNOSIS — K5190 Ulcerative colitis, unspecified, without complications: Secondary | ICD-10-CM | POA: Insufficient documentation

## 2022-09-13 DIAGNOSIS — K760 Fatty (change of) liver, not elsewhere classified: Secondary | ICD-10-CM | POA: Insufficient documentation

## 2022-09-13 DIAGNOSIS — Z87891 Personal history of nicotine dependence: Secondary | ICD-10-CM | POA: Diagnosis not present

## 2022-09-13 DIAGNOSIS — R161 Splenomegaly, not elsewhere classified: Secondary | ICD-10-CM | POA: Insufficient documentation

## 2022-09-13 DIAGNOSIS — R768 Other specified abnormal immunological findings in serum: Secondary | ICD-10-CM | POA: Diagnosis not present

## 2022-09-13 DIAGNOSIS — Z7984 Long term (current) use of oral hypoglycemic drugs: Secondary | ICD-10-CM | POA: Diagnosis not present

## 2022-09-13 DIAGNOSIS — R1310 Dysphagia, unspecified: Secondary | ICD-10-CM | POA: Insufficient documentation

## 2022-09-13 DIAGNOSIS — Z79899 Other long term (current) drug therapy: Secondary | ICD-10-CM | POA: Insufficient documentation

## 2022-09-13 DIAGNOSIS — H548 Legal blindness, as defined in USA: Secondary | ICD-10-CM | POA: Insufficient documentation

## 2022-09-13 DIAGNOSIS — E611 Iron deficiency: Secondary | ICD-10-CM | POA: Insufficient documentation

## 2022-09-13 DIAGNOSIS — Z7982 Long term (current) use of aspirin: Secondary | ICD-10-CM | POA: Insufficient documentation

## 2022-09-14 DIAGNOSIS — E611 Iron deficiency: Secondary | ICD-10-CM | POA: Insufficient documentation

## 2022-09-14 DIAGNOSIS — R768 Other specified abnormal immunological findings in serum: Secondary | ICD-10-CM | POA: Insufficient documentation

## 2022-09-14 NOTE — Assessment & Plan Note (Signed)
Borderline splenomegaly, new since January 2023.  Reactive versus other etiologies. Labs are reviewed and discussed with patient.  No significant immunophenotypic abnormality on peripheral blood flow cytometry, no M protein on multiple myeloma panel, normal  light chain ratio,negative  HIV, normal LDH, negative EBV DNA by PCR, negaive CMV DNA by PCR,  I recommend observation.

## 2022-09-14 NOTE — Progress Notes (Signed)
Hematology/Oncology Progress note [phone] [phone]            Patient Care Team: Idelle Crouch, MD as PCP - General (Internal Medicine)  CHIEF COMPLAINTS/REASON FOR VISIT:  Follow up for splenomegaly  ASSESSMENT & PLAN:   Splenomegaly Borderline splenomegaly, new since January 2023.  Reactive versus other etiologies. Labs are reviewed and discussed with patient.  No significant immunophenotypic abnormality on peripheral blood flow cytometry, no M protein on multiple myeloma panel, normal  light chain ratio,negative  HIV, normal LDH, negative EBV DNA by PCR, negaive CMV DNA by PCR,  I recommend observation.   Fatty liver disease, nonalcoholic Liver function is normal.  Advised healthy diet  Iron deficiency Decreased iron saturation.  Recommend oral iron supplementation. Repeat test at next visit  Elevated antinuclear antibody (ANA) level ANA 1:640 Refer to rheumatology   Orders Placed This Encounter  Procedures   CBC with Differential/Platelet    Standing Status:   Future    Standing Expiration Date:   09/13/2023   Iron and TIBC([facility])    Standing Status:   Future    Standing Expiration Date:   09/14/2023   Ferritin    Standing Status:   Future    Standing Expiration Date:   09/14/2023   Ambulatory referral to Rheumatology    Referral Priority:   Routine    Referral Type:   Consultation    Referral Reason:   Specialty Services Required    Referred to Provider:   Emmaline Kluver., MD    Requested Specialty:   Rheumatology    Number of Visits Requested:   1   Follow up in 4 months.  All questions were answered. The patient knows to call the clinic with any problems, questions or concerns.  Earlie Server, MD, PhD [facility] Hematology Oncology 09/13/2022    HISTORY OF PRESENTING ILLNESS:   Lydia Foster is a  61 y.o.  female with PMH listed below was seen in consultation at the request of  Idelle Crouch, MD   for evaluation of splenomegaly  Patient has complained burning abdominal pain that last couple of minutes. 06/15/2022, ultrasound complete abdomen showed mild splenomegaly with splenic volume of 513 cm, 13.7 x 5.3 x 13.5 cm.  No focal abnormality.  Liver showed no focal lesion.  Heterogeneous and diffusely increased in parenchymal echogenicity, likely hepatic steatosis. Patient had an earlier ultrasound abdomen complete done on 10/22/2021, spleen size was 11.1 cm within normal limits.  06/26/2022, CT abdomen pelvis with contrast showed no acute abdominal pelvic findings.  Spleen platelets of normal/minimally enlarged.  13.3 x 14 x 4.8 cm.  Patient was seen by gastroenterology, she was tested negative for hepatitis B and C panel.  EBV IgG elevated at 596, EBV nuclear antigen antibody 121.  IgM less than 36.  Patient denies any recent virus infection, unintentional weight loss, night sweats, fever. Patient is legally blind. Occasional dysphagia/odynophagia to solid foods.  Heartburn.  Patient has ulcerative colitis and is on mesalamine.   INTERVAL HISTORY Lydia Foster is a 61 y.o. female who has above history reviewed by me today presents for follow up visit for splenomegaly.  Today she has no new complaints.Denies weight loss, fever, chills, fatigue, night sweats.  She gained weight.    MEDICAL HISTORY:  Past Medical History:  Diagnosis Date   Anxiety    Colon polyps    Depression    Diabetes mellitus without complication ([place])    Legally blind  Spleen enlarged     SURGICAL HISTORY: Past Surgical History:  Procedure Laterality Date   carpel tunnel     COLONOSCOPY     COLONOSCOPY WITH PROPOFOL N/A 04/16/2018   Procedure: COLONOSCOPY WITH PROPOFOL;  Surgeon: Manya Silvas, MD;  Location: [facility] ENDOSCOPY;  Service: Endoscopy;  Laterality: N/A;   TUBAL LIGATION      SOCIAL HISTORY: Social History   Socioeconomic History   Marital status: Legally Separated    Spouse name:  Not on file   Number of children: Not on file   Years of education: Not on file   Highest education level: Not on file  Occupational History   Not on file  Tobacco Use   Smoking status: Former   Smokeless tobacco: Never  Vaping Use   Vaping Use: Never used  Substance and Sexual Activity   Alcohol use: Never   Drug use: Never   Sexual activity: Not on file  Other Topics Concern   Not on file  Social History Narrative   Not on file   Social Determinants of Health   Financial Resource Strain: Not on file  Food Insecurity: Not on file  Transportation Needs: Not on file  Physical Activity: Not on file  Stress: Not on file  Social Connections: Not on file  Intimate Partner Violence: Not on file    FAMILY HISTORY: Family History  Problem Relation Age of Onset   Stroke Mother    Hypertension Mother    Diabetes Mother    Diabetes Father    Diabetes Brother    Breast cancer Maternal Aunt    Stroke Paternal Aunt    Stroke Maternal Grandmother    Diabetes Maternal Grandmother    Diabetes Paternal Grandmother     ALLERGIES:  is allergic to codeine, hydrocodone-acetaminophen, sulfa antibiotics, tioconazole, and ibuprofen.  MEDICATIONS:  Current Outpatient Medications  Medication Sig Dispense Refill   aspirin EC 81 MG tablet Take 81 mg by mouth daily. Swallow whole.     Beta Carotene (VITAMIN A) 25000 UNIT capsule Take 25,000 Units by mouth daily.     cholecalciferol (VITAMIN D3) 25 MCG (1000 UNIT) tablet Take 1,000 Units by mouth daily.     cyanocobalamin (VITAMIN B12) 1000 MCG tablet Take 1,000 mcg by mouth daily.     famotidine (PEPCID) 20 MG tablet Take 1 tablet (20 mg total) by mouth daily. 30 tablet 1   fenofibrate (TRICOR) 48 MG tablet Take 48 mg by mouth daily.     lisinopril (PRINIVIL,ZESTRIL) 10 MG tablet Take 10 mg by mouth daily.     mesalamine (LIALDA) 1.2 g EC tablet Take 1.2 g by mouth in the morning and at bedtime.     Multiple Vitamin (MULTIVITAMIN) tablet  Take 1 tablet by mouth daily.     mupirocin ointment (BACTROBAN) 2 % Place 1 application into the nose 3 (three) times daily.     nystatin cream (MYCOSTATIN) Apply 1 application topically 2 (two) times daily.     QUEtiapine (SEROQUEL) 50 MG tablet Take 50 mg by mouth at bedtime.     topiramate (TOPAMAX) 25 MG tablet Take 25 mg by mouth at bedtime.     venlafaxine XR (EFFEXOR-XR) 150 MG 24 hr capsule Take 150 mg by mouth daily with breakfast.     vitamin C (ASCORBIC ACID) 500 MG tablet Take 500 mg by mouth daily.     vitamin E 400 UNIT capsule Take 400 Units by mouth daily.     gabapentin (  NEURONTIN) 300 MG capsule Take 300 mg by mouth daily. (Patient not taking: Reported on 08/09/2022)     metFORMIN (GLUCOPHAGE) 1000 MG tablet Take 1,000 mg by mouth 2 (two) times daily with a meal. (Patient not taking: Reported on 08/09/2022)     sucralfate (CARAFATE) 1 g tablet Take 1 tablet (1 g total) by mouth 4 (four) times daily. (Patient not taking: Reported on 09/13/2022) 120 tablet 1   No current facility-administered medications for this visit.    Review of Systems  Constitutional:  Negative for appetite change, chills, fatigue and fever.  HENT:   Negative for hearing loss and voice change.   Eyes:  Negative for eye problems.  Respiratory:  Negative for chest tightness and cough.   Cardiovascular:  Negative for chest pain.  Gastrointestinal:  Negative for abdominal distention, abdominal pain, blood in stool and nausea.  Endocrine: Negative for hot flashes.  Genitourinary:  Negative for difficulty urinating and frequency.   Musculoskeletal:  Negative for arthralgias.  Skin:  Negative for itching and rash.  Neurological:  Negative for extremity weakness.  Hematological:  Negative for adenopathy.  Psychiatric/Behavioral:  Negative for confusion.    PHYSICAL EXAMINATION: ECOG PERFORMANCE STATUS: 1 - Symptomatic but completely ambulatory Vitals:   09/13/22 1421  BP: (!) 149/75  Pulse: 96   Temp: 97.6 F (36.4 C)  SpO2: 99%   Filed Weights   09/13/22 1421  Weight: 177 lb 11.2 oz (80.6 kg)    Physical Exam Constitutional:      General: She is not in acute distress. HENT:     Head: Normocephalic and atraumatic.  Eyes:     General: No scleral icterus. Cardiovascular:     Rate and Rhythm: Normal rate.  Pulmonary:     Effort: Pulmonary effort is normal. No respiratory distress.  Abdominal:     General: Bowel sounds are normal. There is no distension.  Musculoskeletal:        General: No deformity. Normal range of motion.     Cervical back: Normal range of motion and neck supple.  Skin:    General: Skin is warm and dry.     Findings: No erythema or rash.  Neurological:     Mental Status: She is alert and oriented to person, place, and time. Mental status is at baseline.     Cranial Nerves: No cranial nerve deficit.     Coordination: Coordination normal.  Psychiatric:        Mood and Affect: Mood normal.     LABORATORY DATA:  I have reviewed the data as listed    Latest Ref Rng & Units 08/09/2022   11:51 AM 06/26/2022    2:45 PM 05/24/2022    3:01 PM  CBC  WBC 4.0 - 10.5 K/uL 5.9  5.1  4.7   Hemoglobin 12.0 - 15.0 g/dL 13.2  13.4  12.8   Hematocrit 36.0 - 46.0 % 39.6  40.5  39.3   Platelets 150 - 400 K/uL 264  288  238       Latest Ref Rng & Units 08/09/2022   11:51 AM 06/26/2022    2:45 PM 05/24/2022    3:01 PM  CMP  Glucose 70 - 99 mg/dL 267  100  371   BUN 8 - 23 mg/dL _0 Creatinine 0.44 - 1.00 mg/dL 0.97  0.91  0.82   Sodium 135 - 145 mmol/L 136  141  139   Potassium 3.5 - 5.1  mmol/L 4.0  3.6  3.8   Chloride 98 - 111 mmol/L 101  111  107   CO2 22 - 32 mmol/L _0 Calcium 8.9 - 10.3 mg/dL 9.5  9.7  9.2   Total Protein 6.5 - 8.1 g/dL 8.0  8.2  7.5   Total Bilirubin 0.3 - 1.2 mg/dL 0.6  0.9  0.6   Alkaline Phos 38 - 126 U/L 72  51  69   AST 15 - 41 U/L _1 ALT 0 - 44 U/L _2 RADIOGRAPHIC STUDIES: I  have personally reviewed the radiological images as listed and agreed with the findings in the report. MR BRAIN WO CONTRAST  Result Date: 07/27/2022 CLINICAL DATA:  Ventriculomegaly.  Migraines. EXAM: MRI HEAD WITHOUT CONTRAST TECHNIQUE: Multiplanar, multiecho pulse sequences of the brain and surrounding structures were obtained without intravenous contrast. COMPARISON:  Head CT 05/24/2022 and MRI 12/31/2013 FINDINGS: Brain: There is no evidence of an acute infarct, intracranial hemorrhage, mass, midline shift, or extra-axial fluid collection. 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 Moderate lateral ventriculomegaly has progressed from the prior MRI with greatest dilatation of the atria and occipital horns. The temporal horns are mildly dilated, as is the third ventricle. The sylvian fissures are dilated, while the cerebral sulci are otherwise not significantly enlarged. Minimal T2 FLAIR hyperintensities in the periventricular white matter are nonspecific but may reflect chronic small vessel ischemia. No significant transependymal CSF flow is evident. The cerebellar tonsils are normally positioned. Vascular: Major intracranial vascular flow voids are preserved. Skull and upper cervical spine: Unremarkable bone marrow signal. Sinuses/Orbits: Unremarkable orbits. Paranasal sinuses and mastoid air cells are clear. Other: None. IMPRESSION: 1. No acute intracranial abnormality. 2. Chronic ventriculomegaly which has progressed from 2015, indeterminate for central predominant cerebral atrophy versus normal pressure hydrocephalus. 3. 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 Electronically Signed   By: Logan Bores M.D.   On: 07/27/2022 15:37   CT ABDOMEN PELVIS W CONTRAST  Result Date: 06/26/2022 CLINICAL DATA:  LEFT-sided abdominal pain enlarged spleen. EXAM: CT ABDOMEN AND PELVIS WITH CONTRAST TECHNIQUE: Multidetector CT imaging of the abdomen and pelvis was  performed using the standard protocol following bolus administration of intravenous contrast. RADIATION DOSE REDUCTION: This exam was performed according to the departmental dose-optimization program which includes automated exposure control, adjustment of the mA and/or kV according to patient size and/or use of iterative reconstruction technique. CONTRAST:  140m OMNIPAQUE IOHEXOL 300 MG/ML  SOLN COMPARISON:  Ultrasound 06/14/2022 FINDINGS: Lower chest: Lung bases are clear. Hepatobiliary: No focal hepatic lesion. No biliary duct dilatation. Common bile duct is normal. Pancreas: Pancreas is normal. No ductal dilatation. No pancreatic inflammation. Spleen: Spleen minimally enlarged/upper limits normal measuring 13.3 by 14.0 x 4.8 cm (volume = 470 cm^3). This is similar to recent ultrasound (volume equal 513 cc) Adrenals/urinary tract: Adrenal glands and kidneys are normal. The ureters and bladder normal. Stomach/Bowel: Stomach, small bowel, appendix, and cecum are normal. The colon and rectosigmoid colon are normal. Vascular/Lymphatic: Abdominal aorta is normal caliber. No periportal or retroperitoneal adenopathy. No pelvic adenopathy. Reproductive: Uterus and adnexa unremarkable. Other: No free fluid. Musculoskeletal: No aggressive osseous lesion. IMPRESSION: 1. No acute abdominopelvic findings. 2. Spleen upper limits of normal/minimally enlarged. No change from recent ultrasound. Electronically Signed   By: SSuzy BouchardM.D.   On: 06/26/2022 17:12

## 2022-09-14 NOTE — Assessment & Plan Note (Signed)
Liver function is normal.  Advised healthy diet

## 2022-09-14 NOTE — Assessment & Plan Note (Signed)
ANA 1:640 Refer to rheumatology

## 2022-09-14 NOTE — Assessment & Plan Note (Signed)
Decreased iron saturation.  Recommend oral iron supplementation. Repeat test at next visit

## 2022-09-21 DIAGNOSIS — F419 Anxiety disorder, unspecified: Secondary | ICD-10-CM | POA: Diagnosis not present

## 2022-09-21 DIAGNOSIS — F5105 Insomnia due to other mental disorder: Secondary | ICD-10-CM | POA: Diagnosis not present

## 2022-09-21 DIAGNOSIS — F330 Major depressive disorder, recurrent, mild: Secondary | ICD-10-CM | POA: Diagnosis not present

## 2022-10-10 ENCOUNTER — Encounter: Payer: Self-pay | Admitting: *Deleted

## 2022-10-10 ENCOUNTER — Ambulatory Visit: Payer: PPO | Admitting: Anesthesiology

## 2022-10-10 ENCOUNTER — Ambulatory Visit
Admission: RE | Admit: 2022-10-10 | Discharge: 2022-10-10 | Disposition: A | Discharge status: Home / Self Care | Payer: PPO | Source: Ambulatory Visit | Attending: Gastroenterology | Admitting: Gastroenterology

## 2022-10-10 ENCOUNTER — Encounter
Admission: RE | Disposition: A | Discharge status: Home / Self Care | Payer: Self-pay | Source: Ambulatory Visit | Attending: Gastroenterology

## 2022-10-10 DIAGNOSIS — K219 Gastro-esophageal reflux disease without esophagitis: Secondary | ICD-10-CM | POA: Insufficient documentation

## 2022-10-10 DIAGNOSIS — E1142 Type 2 diabetes mellitus with diabetic polyneuropathy: Secondary | ICD-10-CM | POA: Insufficient documentation

## 2022-10-10 DIAGNOSIS — R1084 Generalized abdominal pain: Secondary | ICD-10-CM | POA: Diagnosis not present

## 2022-10-10 DIAGNOSIS — Z87891 Personal history of nicotine dependence: Secondary | ICD-10-CM | POA: Diagnosis not present

## 2022-10-10 DIAGNOSIS — F419 Anxiety disorder, unspecified: Secondary | ICD-10-CM | POA: Diagnosis not present

## 2022-10-10 DIAGNOSIS — I10 Essential (primary) hypertension: Secondary | ICD-10-CM | POA: Diagnosis not present

## 2022-10-10 DIAGNOSIS — Z79899 Other long term (current) drug therapy: Secondary | ICD-10-CM | POA: Diagnosis not present

## 2022-10-10 DIAGNOSIS — Z7984 Long term (current) use of oral hypoglycemic drugs: Secondary | ICD-10-CM | POA: Diagnosis not present

## 2022-10-10 DIAGNOSIS — K640 First degree hemorrhoids: Secondary | ICD-10-CM | POA: Diagnosis not present

## 2022-10-10 DIAGNOSIS — F32A Depression, unspecified: Secondary | ICD-10-CM | POA: Insufficient documentation

## 2022-10-10 DIAGNOSIS — H548 Legal blindness, as defined in USA: Secondary | ICD-10-CM | POA: Insufficient documentation

## 2022-10-10 DIAGNOSIS — G709 Myoneural disorder, unspecified: Secondary | ICD-10-CM | POA: Insufficient documentation

## 2022-10-10 DIAGNOSIS — K5190 Ulcerative colitis, unspecified, without complications: Secondary | ICD-10-CM | POA: Diagnosis not present

## 2022-10-10 HISTORY — PX: COLONOSCOPY WITH PROPOFOL: SHX5780

## 2022-10-10 HISTORY — DX: Iron deficiency: E61.1

## 2022-10-10 HISTORY — DX: Cerebral infarction, unspecified: I63.9

## 2022-10-10 HISTORY — PX: ESOPHAGOGASTRODUODENOSCOPY (EGD) WITH PROPOFOL: SHX5813

## 2022-10-10 LAB — GLUCOSE, CAPILLARY: Glucose-Capillary: 267 mg/dL — ABNORMAL HIGH (ref 70–99)

## 2022-10-10 SURGERY — COLONOSCOPY WITH PROPOFOL
Anesthesia: General

## 2022-10-10 MED ORDER — PROPOFOL 500 MG/50ML IV EMUL
INTRAVENOUS | Status: DC | PRN
  Administered 2022-10-10: 182.715 ug/kg/min via INTRAVENOUS

## 2022-10-10 MED ORDER — LIDOCAINE HCL (CARDIAC) PF 100 MG/5ML IV SOSY
PREFILLED_SYRINGE | INTRAVENOUS | Status: DC | PRN
  Administered 2022-10-10: 100 mg via INTRAVENOUS

## 2022-10-10 MED ORDER — PHENYLEPHRINE HCL (PRESSORS) 10 MG/ML IV SOLN
INTRAVENOUS | Status: DC | PRN
  Administered 2022-10-10: 240 ug via INTRAVENOUS
  Administered 2022-10-10: 80 ug via INTRAVENOUS
  Administered 2022-10-10: 120 ug via INTRAVENOUS

## 2022-10-10 MED ORDER — EPHEDRINE 5 MG/ML INJ
INTRAVENOUS | Status: AC
  Filled 2022-10-10: qty 5

## 2022-10-10 MED ORDER — PROPOFOL 10 MG/ML IV BOLUS
INTRAVENOUS | Status: DC | PRN
  Administered 2022-10-10: 80 mg via INTRAVENOUS
  Administered 2022-10-10: 20 mg via INTRAVENOUS

## 2022-10-10 MED ORDER — EPHEDRINE SULFATE (PRESSORS) 50 MG/ML IJ SOLN
INTRAMUSCULAR | Status: DC | PRN
  Administered 2022-10-10: 7.5 mg via INTRAVENOUS

## 2022-10-10 MED ORDER — SODIUM CHLORIDE 0.9 % IV SOLN: INTRAVENOUS | Status: DC

## 2022-10-10 MED ORDER — PROPOFOL 1000 MG/100ML IV EMUL
INTRAVENOUS | Status: AC
  Filled 2022-10-10: qty 100

## 2022-10-10 MED ORDER — LIDOCAINE HCL (PF) 2 % IJ SOLN
INTRAMUSCULAR | Status: AC
  Filled 2022-10-10: qty 20

## 2022-10-10 MED ORDER — PHENYLEPHRINE 80 MCG/ML (10ML) SYRINGE FOR IV PUSH (FOR BLOOD PRESSURE SUPPORT)
PREFILLED_SYRINGE | INTRAVENOUS | Status: AC
  Filled 2022-10-10: qty 10

## 2022-10-10 NOTE — Anesthesia Postprocedure Evaluation (Signed)
Anesthesia Post Note  Patient: Lydia Foster  Procedure(s) Performed: COLONOSCOPY WITH PROPOFOL ESOPHAGOGASTRODUODENOSCOPY (EGD) WITH PROPOFOL  Patient location during evaluation: PACU Anesthesia Type: General Level of consciousness: awake and alert, oriented and patient cooperative Pain management: pain level controlled Vital Signs Assessment: post-procedure vital signs reviewed and stable Respiratory status: spontaneous breathing, nonlabored ventilation and respiratory function stable Cardiovascular status: blood pressure returned to baseline and stable Postop Assessment: adequate PO intake Anesthetic complications: no   No notable events documented.   Last Vitals:  Vitals:   10/10/22 1127 10/10/22 1136  BP: 114/68 129/74  Pulse: 77 76  Resp: (!) 21 12  Temp:    SpO2: 100% 99%    Last Pain:  Vitals:   10/10/22 1136  TempSrc:   PainSc: 0-No pain                 Darrin Nipper

## 2022-10-10 NOTE — Op Note (Signed)
[facility] Gastroenterology Patient Name: Lydia Foster Procedure Date: 10/10/2022 10:03 AM MRN: [id number] Account #: [id number] Date of Birth: [birth year] Admit Type: Outpatient Age: 62 Room: [facility] ENDO ROOM 2 Gender: Female Note Status: Finalized Instrument Name: Altamese Cabal Endoscope [id number] Procedure:             Upper GI endoscopy Indications:           Generalized abdominal pain, Gastro-esophageal reflux                         disease Providers:             Andrey Farmer MD, MD Referring MD:          Leonie Douglas. Doy Hutching, MD (Referring MD) Medicines:             Monitored Anesthesia Care Complications:         No immediate complications. Procedure:             Pre-Anesthesia Assessment:                        - Prior to the procedure, a History and Physical was                         performed, and patient medications and allergies were                         reviewed. The patient is competent. The risks and                         benefits of the procedure and the sedation options and                         risks were discussed with the patient. All questions                         were answered and informed consent was obtained.                         Patient identification and proposed procedure were                         verified by the physician, the nurse, the                         anesthesiologist, the anesthetist and the technician                         in the endoscopy suite. Mental Status Examination:                         alert and oriented. Airway Examination: normal                         oropharyngeal airway and neck mobility. Respiratory                         Examination: clear to auscultation. CV Examination:  normal. Prophylactic Antibiotics: The patient does not                         require prophylactic antibiotics. Prior                         Anticoagulants: The patient has taken no anticoagulant                          or antiplatelet agents. ASA Grade Assessment: III - A                         patient with severe systemic disease. After reviewing                         the risks and benefits, the patient was deemed in                         satisfactory condition to undergo the procedure. The                         anesthesia plan was to use monitored anesthesia care                         (MAC). Immediately prior to administration of                         medications, the patient was re-assessed for adequacy                         to receive sedatives. The heart rate, respiratory                         rate, oxygen saturations, blood pressure, adequacy of                         pulmonary ventilation, and response to care were                         monitored throughout the procedure. The physical                         status of the patient was re-assessed after the                         procedure.                        After obtaining informed consent, the endoscope was                         passed under direct vision. Throughout the procedure,                         the patient's blood pressure, pulse, and oxygen                         saturations were monitored continuously. The Endoscope  was introduced through the mouth, and advanced to the                         second part of duodenum. The upper GI endoscopy was                         accomplished without difficulty. The patient tolerated                         the procedure well. Findings:      The examined esophagus was normal.      The entire examined stomach was normal.      The examined duodenum was normal. Impression:            - Normal esophagus.                        - Normal stomach.                        - Normal examined duodenum.                        - No specimens collected. Recommendation:        - Discharge patient to home.                        - Resume  previous diet.                        - Continue present medications.                        - Perform a colonoscopy today.                        - Return to referring physician as previously                         scheduled. Procedure Code(s):     --- Professional ---                        [phone], Esophagogastroduodenoscopy, flexible,                         transoral; diagnostic, including collection of                         specimen(s) by brushing or washing, when performed                         (separate procedure) Diagnosis Code(s):     --- Professional ---                        R10.84, Generalized abdominal pain                        K21.9, Gastro-esophageal reflux disease without                         esophagitis CPT copyright 2022 American Medical Association. All rights reserved. The codes documented in  this report are preliminary and upon coder review may  be revised to meet current compliance requirements. Andrey Farmer MD, MD 10/10/2022 11:12:01 AM Number of Addenda: 0 Note Initiated On: 10/10/2022 10:03 AM Estimated Blood Loss:  Estimated blood loss: none.      [facility]

## 2022-10-10 NOTE — H&P (Signed)
Outpatient short stay form Pre-procedure 10/10/2022  Lydia Rubenstein, MD  Primary Physician: Idelle Crouch, MD  Reason for visit:  Abdominal pain/History of UC  History of present illness:    62 y/o lady with history of UC, hypertension, and DM II here for EGD/Colonoscopy for abdominal pain. No blood thinners. No family history of colon cancer. Last colonoscopy in 2021. Endorses pill dysphagia    Current Facility-Administered Medications:    0.9 %  sodium chloride infusion, , Intravenous, Continuous, Devyon Keator, Hilton Cork, MD, Last Rate: 20 mL/hr at 10/10/22 1015, New Bag at 10/10/22 1015  Medications Prior to Admission  Medication Sig Dispense Refill Last Dose   aspirin EC 81 MG tablet Take 81 mg by mouth daily. Swallow whole.   10/09/2022   famotidine (PEPCID) 20 MG tablet Take 1 tablet (20 mg total) by mouth daily. 30 tablet 1 Past Month   QUEtiapine (SEROQUEL) 50 MG tablet Take 50 mg by mouth at bedtime.   Past Month   sucralfate (CARAFATE) 1 g tablet Take 1 tablet (1 g total) by mouth 4 (four) times daily. 120 tablet 1 Past Month   topiramate (TOPAMAX) 25 MG tablet Take 25 mg by mouth at bedtime.   10/09/2022   venlafaxine XR (EFFEXOR-XR) 150 MG 24 hr capsule Take 150 mg by mouth daily with breakfast.   10/09/2022   Beta Carotene (VITAMIN A) 25000 UNIT capsule Take 25,000 Units by mouth daily.   10/03/2022   cholecalciferol (VITAMIN D3) 25 MCG (1000 UNIT) tablet Take 1,000 Units by mouth daily.   10/03/2022   cyanocobalamin (VITAMIN B12) 1000 MCG tablet Take 1,000 mcg by mouth daily.   10/03/2022   fenofibrate (TRICOR) 48 MG tablet Take 48 mg by mouth daily.   10/07/2022   gabapentin (NEURONTIN) 300 MG capsule Take 300 mg by mouth daily. (Patient not taking: Reported on 08/09/2022)   Not Taking   lisinopril (PRINIVIL,ZESTRIL) 10 MG tablet Take 10 mg by mouth daily.   10/07/2022   mesalamine (LIALDA) 1.2 g EC tablet Take 1.2 g by mouth in the morning and at bedtime.      metFORMIN  (GLUCOPHAGE) 1000 MG tablet Take 1,000 mg by mouth 2 (two) times daily with a meal. (Patient not taking: Reported on 08/09/2022)   Not Taking   Multiple Vitamin (MULTIVITAMIN) tablet Take 1 tablet by mouth daily.   10/03/2022   mupirocin ointment (BACTROBAN) 2 % Place 1 application into the nose 3 (three) times daily.      nystatin cream (MYCOSTATIN) Apply 1 application topically 2 (two) times daily.      vitamin C (ASCORBIC ACID) 500 MG tablet Take 500 mg by mouth daily.   10/03/2022   vitamin E 400 UNIT capsule Take 400 Units by mouth daily.   10/03/2022     Allergies  Allergen Reactions   Codeine Other (See Comments)   Hydrocodone-Acetaminophen Other (See Comments)   Sulfa Antibiotics Other (See Comments)   Tioconazole Other (See Comments)    worses symptoms   Ibuprofen Rash     Past Medical History:  Diagnosis Date   Anxiety    Colon polyps    Depression    Diabetes mellitus without complication ([place])    Legally blind    Low iron    Recurrent strokes ([facility])    Spleen enlarged     Review of systems:  Otherwise negative.    Physical Exam  Gen: Alert, oriented. Appears stated age.  HEENT: PERRLA. Lungs: No respiratory  distress CV: RRR Abd: soft, benign, no masses Ext: No edema    Planned procedures: Proceed with EGD/colonoscopy. The patient understands the nature of the planned procedure, indications, risks, alternatives and potential complications including but not limited to bleeding, infection, perforation, damage to internal organs and possible oversedation/side effects from anesthesia. The patient agrees and gives consent to proceed.  Please refer to procedure notes for findings, recommendations and patient disposition/instructions.     Lydia Rubenstein, MD [facility] Gastroenterology

## 2022-10-10 NOTE — Op Note (Signed)
[facility] Gastroenterology Patient Name: Lydia Foster Procedure Date: 10/10/2022 10:03 AM MRN: [id number] Account #: [id number] Date of Birth: May 16, 1961 Admit Type: Outpatient Age: 62 Room: [facility] ENDO ROOM 2 Gender: Female Note Status: Finalized Instrument Name: Jasper Riling [id number] Procedure:             Colonoscopy Indications:           Generalized abdominal pain, Ulcerative colitis Providers:             Andrey Farmer MD, MD Referring MD:          Leonie Douglas. Doy Hutching, MD (Referring MD) Medicines:             Monitored Anesthesia Care Complications:         No immediate complications. Estimated blood loss:                         Minimal. Procedure:             Pre-Anesthesia Assessment:                        - Prior to the procedure, a History and Physical was                         performed, and patient medications and allergies were                         reviewed. The patient is competent. The risks and                         benefits of the procedure and the sedation options and                         risks were discussed with the patient. All questions                         were answered and informed consent was obtained.                         Patient identification and proposed procedure were                         verified by the physician, the nurse, the                         anesthesiologist, the anesthetist and the technician                         in the endoscopy suite. Mental Status Examination:                         alert and oriented. Airway Examination: normal                         oropharyngeal airway and neck mobility. Respiratory                         Examination: clear to auscultation. CV Examination:  normal. Prophylactic Antibiotics: The patient does not                         require prophylactic antibiotics. Prior                         Anticoagulants: The patient has taken no  anticoagulant                         or antiplatelet agents. ASA Grade Assessment: III - A                         patient with severe systemic disease. After reviewing                         the risks and benefits, the patient was deemed in                         satisfactory condition to undergo the procedure. The                         anesthesia plan was to use monitored anesthesia care                         (MAC). Immediately prior to administration of                         medications, the patient was re-assessed for adequacy                         to receive sedatives. The heart rate, respiratory                         rate, oxygen saturations, blood pressure, adequacy of                         pulmonary ventilation, and response to care were                         monitored throughout the procedure. The physical                         status of the patient was re-assessed after the                         procedure.                        After obtaining informed consent, the colonoscope was                         passed under direct vision. Throughout the procedure,                         the patient's blood pressure, pulse, and oxygen                         saturations were monitored continuously. The  Colonoscope was introduced through the anus and                         advanced to the the terminal ileum. The colonoscopy                         was performed without difficulty. The patient                         tolerated the procedure well. The quality of the bowel                         preparation was good. The terminal ileum, ileocecal                         valve, appendiceal orifice, and rectum were                         photographed. Findings:      The perianal and digital rectal examinations were normal.      The terminal ileum appeared normal.      A tattoo was seen in the descending colon. The tattoo site appeared        normal.      A tattoo was seen in the sigmoid colon. The tattoo site appeared normal.      Normal mucosa was found in the entire colon. Biopsies were taken with a       cold forceps for histology. Estimated blood loss was minimal.      Internal hemorrhoids were found during retroflexion. The hemorrhoids       were Grade I (internal hemorrhoids that do not prolapse).      The exam was otherwise without abnormality on direct and retroflexion       views. Impression:            - The examined portion of the ileum was normal.                        - A tattoo was seen in the descending colon. The                         tattoo site appeared normal.                        - A tattoo was seen in the sigmoid colon. The tattoo                         site appeared normal.                        - Normal mucosa in the entire examined colon. Biopsied.                        - Internal hemorrhoids.                        - The examination was otherwise normal on direct and                         retroflexion views. Recommendation:        -  Discharge patient to home.                        - Resume previous diet.                        - Continue present medications.                        - Await pathology results.                        - Repeat colonoscopy for surveillance based on                         pathology results.                        - Return to referring physician as previously                         scheduled. Procedure Code(s):     --- Professional ---                        [phone], Colonoscopy, flexible; with biopsy, single or                         multiple Diagnosis Code(s):     --- Professional ---                        K64.0, First degree hemorrhoids                        R10.84, Generalized abdominal pain                        K51.90, Ulcerative colitis, unspecified, without                         complications CPT copyright 2022 American Medical Association. All  rights reserved. The codes documented in this report are preliminary and upon coder review may  be revised to meet current compliance requirements. Andrey Farmer MD, MD 10/10/2022 11:18:20 AM Number of Addenda: 0 Note Initiated On: 10/10/2022 10:03 AM Scope Withdrawal Time: 0 hours 9 minutes 14 seconds  Total Procedure Duration: 0 hours 14 minutes 20 seconds  Estimated Blood Loss:  Estimated blood loss was minimal.      [facility]

## 2022-10-10 NOTE — Transfer of Care (Signed)
Immediate Anesthesia Transfer of Care Note  Patient: Lydia Foster  Procedure(s) Performed: COLONOSCOPY WITH PROPOFOL ESOPHAGOGASTRODUODENOSCOPY (EGD) WITH PROPOFOL  Patient Location: Endoscopy Unit  Anesthesia Type:General  Level of Consciousness: drowsy  Airway & Oxygen Therapy: Patient Spontanous Breathing  Post-op Assessment: Report given to RN and Post -op Vital signs reviewed and stable  Post vital signs: Reviewed and stable  Last Vitals:  Vitals Value Taken Time  BP 127/64 10/10/22 1110  Temp 36.2 C 10/10/22 1107  Pulse 56 10/10/22 1110  Resp 17 10/10/22 1110  SpO2 99 % 10/10/22 1110    Last Pain:  Vitals:   10/10/22 1107  TempSrc: Temporal  PainSc: Asleep         Complications: No notable events documented.

## 2022-10-10 NOTE — Interval H&P Note (Signed)
History and Physical Interval Note:  10/10/2022 10:36 AM  Page Spiro  has presented today for surgery, with the diagnosis of Huetter.  The various methods of treatment have been discussed with the patient and family. After consideration of risks, benefits and other options for treatment, the patient has consented to  Procedure(s): COLONOSCOPY WITH PROPOFOL (N/A) ESOPHAGOGASTRODUODENOSCOPY (EGD) WITH PROPOFOL (N/A) as a surgical intervention.  The patient's history has been reviewed, patient examined, no change in status, stable for surgery.  I have reviewed the patient's chart and labs.  Questions were answered to the patient's satisfaction.     Lesly Rubenstein  Ok to proceed with EGD/Colonoscopy

## 2022-10-10 NOTE — Anesthesia Preprocedure Evaluation (Addendum)
Anesthesia Evaluation  Patient identified by MRN, date of birth, ID band Patient awake    Reviewed: Allergy & Precautions, NPO status , Patient's Chart, lab work & pertinent test results  History of Anesthesia Complications Negative for: history of anesthetic complications  Airway Mallampati: IV   Neck ROM: Full    Dental  (+) Missing, Chipped   Pulmonary former smoker (quit greater than 10 years ago)   Pulmonary exam normal breath sounds clear to auscultation       Cardiovascular negative cardio ROS Normal cardiovascular exam Rhythm:Regular Rate:Normal     Neuro/Psych  PSYCHIATRIC DISORDERS Anxiety Depression    Legally blind TIA Neuromuscular disease (diabetic polyneuropathy)    GI/Hepatic negative GI ROS,,,  Endo/Other  diabetes, Type 2    Renal/GU negative Renal ROS     Musculoskeletal   Abdominal   Peds  Hematology negative hematology ROS (+)   Anesthesia Other Findings   Reproductive/Obstetrics                             Anesthesia Physical Anesthesia Plan  ASA: 3  Anesthesia Plan: General   Post-op Pain Management:    Induction: Intravenous  PONV Risk Score and Plan: 3 and Propofol infusion, TIVA and Treatment may vary due to age or medical condition  Airway Management Planned: Natural Airway  Additional Equipment:   Intra-op Plan:   Post-operative Plan:   Informed Consent: I have reviewed the patients History and Physical, chart, labs and discussed the procedure including the risks, benefits and alternatives for the proposed anesthesia with the patient or authorized representative who has indicated his/her understanding and acceptance.       Plan Discussed with: CRNA  Anesthesia Plan Comments: (LMA/GETA backup discussed.  Patient consented for risks of anesthesia including but not limited to:  - adverse reactions to medications - damage to eyes, teeth, lips  or other oral mucosa - nerve damage due to positioning  - sore throat or hoarseness - damage to heart, brain, nerves, lungs, other parts of body or loss of life  Informed patient about role of CRNA in peri- and intra-operative care.  Patient voiced understanding.)       Anesthesia Quick Evaluation

## 2022-10-11 ENCOUNTER — Encounter: Payer: Self-pay | Admitting: Gastroenterology

## 2022-10-11 LAB — SURGICAL PATHOLOGY

## 2022-10-20 DIAGNOSIS — Z01419 Encounter for gynecological examination (general) (routine) without abnormal findings: Secondary | ICD-10-CM | POA: Diagnosis not present

## 2022-10-20 DIAGNOSIS — Z124 Encounter for screening for malignant neoplasm of cervix: Secondary | ICD-10-CM | POA: Diagnosis not present

## 2022-10-20 DIAGNOSIS — Z1331 Encounter for screening for depression: Secondary | ICD-10-CM | POA: Diagnosis not present

## 2022-10-21 ENCOUNTER — Other Ambulatory Visit: Payer: Self-pay | Admitting: Certified Nurse Midwife

## 2022-10-21 DIAGNOSIS — Z1231 Encounter for screening mammogram for malignant neoplasm of breast: Secondary | ICD-10-CM

## 2022-10-24 DIAGNOSIS — F330 Major depressive disorder, recurrent, mild: Secondary | ICD-10-CM | POA: Diagnosis not present

## 2022-10-24 DIAGNOSIS — F419 Anxiety disorder, unspecified: Secondary | ICD-10-CM | POA: Diagnosis not present

## 2022-10-24 DIAGNOSIS — F5105 Insomnia due to other mental disorder: Secondary | ICD-10-CM | POA: Diagnosis not present

## 2022-11-14 DIAGNOSIS — F330 Major depressive disorder, recurrent, mild: Secondary | ICD-10-CM | POA: Diagnosis not present

## 2022-11-14 DIAGNOSIS — F5105 Insomnia due to other mental disorder: Secondary | ICD-10-CM | POA: Diagnosis not present

## 2022-11-14 DIAGNOSIS — F419 Anxiety disorder, unspecified: Secondary | ICD-10-CM | POA: Diagnosis not present

## 2022-11-17 DIAGNOSIS — G9389 Other specified disorders of brain: Secondary | ICD-10-CM | POA: Diagnosis not present

## 2022-11-17 DIAGNOSIS — G43719 Chronic migraine without aura, intractable, without status migrainosus: Secondary | ICD-10-CM | POA: Diagnosis not present

## 2022-11-17 DIAGNOSIS — E1142 Type 2 diabetes mellitus with diabetic polyneuropathy: Secondary | ICD-10-CM | POA: Diagnosis not present

## 2022-11-17 DIAGNOSIS — F5101 Primary insomnia: Secondary | ICD-10-CM | POA: Diagnosis not present

## 2022-11-22 DIAGNOSIS — R87615 Unsatisfactory cytologic smear of cervix: Secondary | ICD-10-CM | POA: Diagnosis not present

## 2022-12-05 DIAGNOSIS — F419 Anxiety disorder, unspecified: Secondary | ICD-10-CM | POA: Diagnosis not present

## 2022-12-05 DIAGNOSIS — F330 Major depressive disorder, recurrent, mild: Secondary | ICD-10-CM | POA: Diagnosis not present

## 2022-12-05 DIAGNOSIS — F5105 Insomnia due to other mental disorder: Secondary | ICD-10-CM | POA: Diagnosis not present

## 2022-12-08 DIAGNOSIS — K5190 Ulcerative colitis, unspecified, without complications: Secondary | ICD-10-CM | POA: Diagnosis not present

## 2022-12-08 DIAGNOSIS — I10 Essential (primary) hypertension: Secondary | ICD-10-CM | POA: Diagnosis not present

## 2022-12-08 DIAGNOSIS — Z79899 Other long term (current) drug therapy: Secondary | ICD-10-CM | POA: Diagnosis not present

## 2022-12-08 DIAGNOSIS — E1165 Type 2 diabetes mellitus with hyperglycemia: Secondary | ICD-10-CM | POA: Diagnosis not present

## 2022-12-08 DIAGNOSIS — F331 Major depressive disorder, recurrent, moderate: Secondary | ICD-10-CM | POA: Diagnosis not present

## 2022-12-08 DIAGNOSIS — Z1231 Encounter for screening mammogram for malignant neoplasm of breast: Secondary | ICD-10-CM | POA: Diagnosis not present

## 2022-12-08 DIAGNOSIS — E782 Mixed hyperlipidemia: Secondary | ICD-10-CM | POA: Diagnosis not present

## 2022-12-08 DIAGNOSIS — R768 Other specified abnormal immunological findings in serum: Secondary | ICD-10-CM | POA: Diagnosis not present

## 2023-01-13 ENCOUNTER — Inpatient Hospital Stay: Payer: PPO | Attending: Oncology

## 2023-01-13 DIAGNOSIS — R161 Splenomegaly, not elsewhere classified: Secondary | ICD-10-CM | POA: Insufficient documentation

## 2023-01-13 DIAGNOSIS — Z87891 Personal history of nicotine dependence: Secondary | ICD-10-CM | POA: Diagnosis not present

## 2023-01-13 DIAGNOSIS — Z7982 Long term (current) use of aspirin: Secondary | ICD-10-CM | POA: Diagnosis not present

## 2023-01-13 DIAGNOSIS — H548 Legal blindness, as defined in USA: Secondary | ICD-10-CM | POA: Insufficient documentation

## 2023-01-13 DIAGNOSIS — D509 Iron deficiency anemia, unspecified: Secondary | ICD-10-CM | POA: Insufficient documentation

## 2023-01-13 DIAGNOSIS — R1310 Dysphagia, unspecified: Secondary | ICD-10-CM | POA: Diagnosis not present

## 2023-01-13 DIAGNOSIS — Z79899 Other long term (current) drug therapy: Secondary | ICD-10-CM | POA: Insufficient documentation

## 2023-01-13 LAB — IRON AND TIBC
Iron: 107 ug/dL (ref 28–170)
Saturation Ratios: 24 % (ref 10.4–31.8)
TIBC: 441 ug/dL (ref 250–450)
UIBC: 334 ug/dL

## 2023-01-13 LAB — CBC WITH DIFFERENTIAL/PLATELET
Abs Immature Granulocytes: 0.02 10*3/uL (ref 0.00–0.07)
Basophils Absolute: 0 10*3/uL (ref 0.0–0.1)
Basophils Relative: 1 %
Eosinophils Absolute: 0.1 10*3/uL (ref 0.0–0.5)
Eosinophils Relative: 1 %
HCT: 41.6 % (ref 36.0–46.0)
Hemoglobin: 13.6 g/dL (ref 12.0–15.0)
Immature Granulocytes: 0 %
Lymphocytes Relative: 18 %
Lymphs Abs: 1.2 10*3/uL (ref 0.7–4.0)
MCH: 27.6 pg (ref 26.0–34.0)
MCHC: 32.7 g/dL (ref 30.0–36.0)
MCV: 84.4 fL (ref 80.0–100.0)
Monocytes Absolute: 0.3 10*3/uL (ref 0.1–1.0)
Monocytes Relative: 5 %
Neutro Abs: 4.7 10*3/uL (ref 1.7–7.7)
Neutrophils Relative %: 75 %
Platelets: 227 10*3/uL (ref 150–400)
RBC: 4.93 MIL/uL (ref 3.87–5.11)
RDW: 13.8 % (ref 11.5–15.5)
WBC: 6.4 10*3/uL (ref 4.0–10.5)
nRBC: 0 % (ref 0.0–0.2)

## 2023-01-13 LAB — FERRITIN: Ferritin: 93 ng/mL (ref 11–307)

## 2023-01-20 ENCOUNTER — Inpatient Hospital Stay (HOSPITAL_BASED_OUTPATIENT_CLINIC_OR_DEPARTMENT_OTHER): Payer: PPO | Admitting: Oncology

## 2023-01-20 ENCOUNTER — Encounter: Payer: Self-pay | Admitting: Oncology

## 2023-01-20 VITALS — BP 154/70 | HR 67 | Temp 96.0°F | Resp 18 | Wt 169.2 lb

## 2023-01-20 DIAGNOSIS — E611 Iron deficiency: Secondary | ICD-10-CM

## 2023-01-20 DIAGNOSIS — R768 Other specified abnormal immunological findings in serum: Secondary | ICD-10-CM | POA: Diagnosis not present

## 2023-01-20 DIAGNOSIS — R161 Splenomegaly, not elsewhere classified: Secondary | ICD-10-CM | POA: Diagnosis not present

## 2023-01-20 NOTE — Progress Notes (Signed)
Hematology/Oncology Progress note [phone] [phone]) [phone]         CHIEF COMPLAINTS/REASON FOR VISIT:  Follow up for splenomegaly  ASSESSMENT & PLAN:   Splenomegaly Borderline splenomegaly, new since January 2023.  Possibly reactive.  May be secondary to underlying autoimmune disease.  Pending rheumatology workup. Labs are reviewed and discussed with patient.  No significant immunophenotypic abnormality on peripheral blood flow cytometry, no M protein on multiple myeloma panel, normal  light chain ratio,negative  HIV, normal LDH, negative EBV DNA by PCR, negaive CMV DNA by PCR,  I recommend observation.   Iron deficiency Labs reviewed and discussed with patient. Patient has improved hemoglobin and iron panel. No need for IV Venofer treatments.  Elevated antinuclear antibody (ANA) level ANA 1:640 Refer to rheumatology  Patient elected to be discharged from my clinic. I recommend patient to continue follow up with primary care physician. Patient may re-establish care in the future if clinically indicated.    All questions were answered.  Lydia Patience, MD, PhD [facility] Hematology Oncology 01/20/2023    HISTORY OF PRESENTING ILLNESS:   Lydia Foster is a  62 y.o.  female with PMH listed below was seen in consultation at the request of  Lydia Arbour, MD  for evaluation of splenomegaly  Patient has complained burning abdominal pain that last couple of minutes. 06/15/2022, ultrasound complete abdomen showed mild splenomegaly with splenic volume of 513 cm, 13.7 x 5.3 x 13.5 cm.  No focal abnormality.  Liver showed no focal lesion.  Heterogeneous and diffusely increased in parenchymal echogenicity, likely hepatic steatosis. Patient had an earlier ultrasound abdomen complete done on 10/22/2021, spleen size was 11.1 cm within normal limits.  06/26/2022, CT abdomen pelvis with contrast showed no acute abdominal pelvic findings.  Spleen platelets of  normal/minimally enlarged.  13.3 x 14 x 4.8 cm.  Patient was seen by gastroenterology, she was tested negative for hepatitis B and C panel.  EBV IgG elevated at 596, EBV nuclear antigen antibody 121.  IgM less than 36.  Patient denies any recent virus infection, unintentional weight loss, night sweats, fever. Patient is legally blind. Occasional dysphagia/odynophagia to solid foods.  Heartburn.  Patient has ulcerative colitis and is on mesalamine.   INTERVAL HISTORY Lydia Foster is a 62 y.o. female who has above history reviewed by me today presents for follow up visit for splenomegaly.  Iron deficiency anemia Today she has no new complaints.Denies weight loss, fever, chills, fatigue, night sweats.     MEDICAL HISTORY:  Past Medical History:  Diagnosis Date   Anxiety    Colon polyps    Depression    Diabetes mellitus without complication (HCC)    Legally blind    Low iron    Recurrent strokes (HCC)    Spleen enlarged     SURGICAL HISTORY: Past Surgical History:  Procedure Laterality Date   carpel tunnel     COLONOSCOPY     COLONOSCOPY WITH PROPOFOL N/A 04/16/2018   Procedure: COLONOSCOPY WITH PROPOFOL;  Surgeon: Scot Jun, MD;  Location: [facility] ENDOSCOPY;  Service: Endoscopy;  Laterality: N/A;   COLONOSCOPY WITH PROPOFOL N/A 10/10/2022   Procedure: COLONOSCOPY WITH PROPOFOL;  Surgeon: Regis Bill, MD;  Location: [facility] ENDOSCOPY;  Service: Endoscopy;  Laterality: N/A;   ESOPHAGOGASTRODUODENOSCOPY (EGD) WITH PROPOFOL N/A 10/10/2022   Procedure: ESOPHAGOGASTRODUODENOSCOPY (EGD) WITH PROPOFOL;  Surgeon: Regis Bill, MD;  Location: [facility] ENDOSCOPY;  Service: Endoscopy;  Laterality: N/A;   TUBAL LIGATION  SOCIAL HISTORY: Social History   Socioeconomic History   Marital status: Legally Separated    Spouse name: Not on file   Number of children: Not on file   Years of education: Not on file   Highest education level: Not on file  Occupational History    Not on file  Tobacco Use   Smoking status: Former    Types: Cigarettes   Smokeless tobacco: Never  Vaping Use   Vaping Use: Never used  Substance and Sexual Activity   Alcohol use: Never   Drug use: Never   Sexual activity: Not on file  Other Topics Concern   Not on file  Social History Narrative   Not on file   Social Determinants of Health   Financial Resource Strain: Not on file  Food Insecurity: Not on file  Transportation Needs: Not on file  Physical Activity: Not on file  Stress: Not on file  Social Connections: Not on file  Intimate Partner Violence: Not on file    FAMILY HISTORY: Family History  Problem Relation Age of Onset   Stroke Mother    Hypertension Mother    Diabetes Mother    Diabetes Father    Diabetes Brother    Breast cancer Maternal Aunt    Stroke Paternal Aunt    Stroke Maternal Grandmother    Diabetes Maternal Grandmother    Diabetes Paternal Grandmother     ALLERGIES:  is allergic to codeine, hydrocodone-acetaminophen, sulfa antibiotics, tioconazole, and ibuprofen.  MEDICATIONS:  Current Outpatient Medications  Medication Sig Dispense Refill   aspirin EC 81 MG tablet Take 81 mg by mouth daily. Swallow whole.     Beta Carotene (VITAMIN A) 25000 UNIT capsule Take 25,000 Units by mouth daily.     cholecalciferol (VITAMIN D3) 25 MCG (1000 UNIT) tablet Take 1,000 Units by mouth daily.     cyanocobalamin (VITAMIN B12) 1000 MCG tablet Take 1,000 mcg by mouth daily.     famotidine (PEPCID) 20 MG tablet Take 1 tablet (20 mg total) by mouth daily. 30 tablet 1   fenofibrate (TRICOR) 48 MG tablet Take 48 mg by mouth daily.     ferrous sulfate 325 (65 FE) MG EC tablet Take 325 mg by mouth daily with breakfast.     glimepiride (AMARYL) 4 MG tablet Take 4 mg by mouth 2 (two) times daily.     lisinopril (PRINIVIL,ZESTRIL) 10 MG tablet Take 10 mg by mouth daily.     mesalamine (LIALDA) 1.2 g EC tablet Take 1.2 g by mouth in the morning and at bedtime.      Multiple Vitamin (MULTIVITAMIN) tablet Take 1 tablet by mouth daily.     mupirocin ointment (BACTROBAN) 2 % Place 1 application into the nose 3 (three) times daily.     nystatin cream (MYCOSTATIN) Apply 1 application topically 2 (two) times daily.     pantoprazole (PROTONIX) 40 MG tablet Take 40 mg by mouth 2 (two) times daily.     QUEtiapine (SEROQUEL) 50 MG tablet Take 50 mg by mouth at bedtime.     sertraline (ZOLOFT) 100 MG tablet Take 100 mg by mouth every morning.     topiramate (TOPAMAX) 25 MG tablet Take 25 mg by mouth at bedtime.     venlafaxine XR (EFFEXOR-XR) 75 MG 24 hr capsule Take 75 mg by mouth every morning.     vitamin C (ASCORBIC ACID) 500 MG tablet Take 500 mg by mouth daily.     vitamin E 400 UNIT  capsule Take 400 Units by mouth daily.     sucralfate (CARAFATE) 1 g tablet Take 1 tablet (1 g total) by mouth 4 (four) times daily. (Patient not taking: Reported on 01/20/2023) 120 tablet 1   No current facility-administered medications for this visit.    Review of Systems  Constitutional:  Negative for appetite change, chills, fatigue and fever.  HENT:   Negative for hearing loss and voice change.   Eyes:  Negative for eye problems.  Respiratory:  Negative for chest tightness and cough.   Cardiovascular:  Negative for chest pain.  Gastrointestinal:  Negative for abdominal distention, abdominal pain, blood in stool and nausea.  Endocrine: Negative for hot flashes.  Genitourinary:  Negative for difficulty urinating and frequency.   Musculoskeletal:  Negative for arthralgias.  Skin:  Negative for itching and rash.  Neurological:  Negative for extremity weakness.  Hematological:  Negative for adenopathy.  Psychiatric/Behavioral:  Negative for confusion.    PHYSICAL EXAMINATION: ECOG PERFORMANCE STATUS: 1 - Symptomatic but completely ambulatory Vitals:   01/20/23 1015  BP: (!) 154/70  Pulse: 67  Resp: 18  Temp: (!) 96 F (35.6 C)  SpO2: 100%   Filed Weights    01/20/23 1015  Weight: 169 lb 3.2 oz (76.7 kg)    Physical Exam Constitutional:      General: She is not in acute distress. HENT:     Head: Normocephalic and atraumatic.  Eyes:     General: No scleral icterus. Cardiovascular:     Rate and Rhythm: Normal rate.  Pulmonary:     Effort: Pulmonary effort is normal. No respiratory distress.  Abdominal:     General: Bowel sounds are normal. There is no distension.  Musculoskeletal:        General: No deformity. Normal range of motion.     Cervical back: Normal range of motion and neck supple.  Skin:    General: Skin is warm and dry.     Findings: No erythema or rash.  Neurological:     Mental Status: She is alert and oriented to person, place, and time. Mental status is at baseline.     Cranial Nerves: No cranial nerve deficit.     Coordination: Coordination normal.  Psychiatric:        Mood and Affect: Mood normal.     LABORATORY DATA:  I have reviewed the data as listed    Latest Ref Rng & Units 01/13/2023    9:48 AM 08/09/2022   11:51 AM 06/26/2022    2:45 PM  CBC  WBC 4.0 - 10.5 K/uL 6.4  5.9  5.1   Hemoglobin 12.0 - 15.0 g/dL 16.1  09.6  04.5   Hematocrit 36.0 - 46.0 % 41.6  39.6  40.5   Platelets 150 - 400 K/uL 227  264  288       Latest Ref Rng & Units 08/09/2022   11:51 AM 06/26/2022    2:45 PM 05/24/2022    3:01 PM  CMP  Glucose 70 - 99 mg/dL 409  811  914   BUN 8 - 23 mg/dL 26  12  11    Creatinine 0.44 - 1.00 mg/dL 7.82  9.56  2.13   Sodium 135 - 145 mmol/L 136  141  139   Potassium 3.5 - 5.1 mmol/L 4.0  3.6  3.8   Chloride 98 - 111 mmol/L 101  111  107   CO2 22 - 32 mmol/L 26  22  24  Calcium 8.9 - 10.3 mg/dL 9.5  9.7  9.2   Total Protein 6.5 - 8.1 g/dL 8.0  8.2  7.5   Total Bilirubin 0.3 - 1.2 mg/dL 0.6  0.9  0.6   Alkaline Phos 38 - 126 U/L 72  51  69   AST 15 - 41 U/L 20  21  19    ALT 0 - 44 U/L 16  16  11        RADIOGRAPHIC STUDIES: I have personally reviewed the radiological images as listed  and agreed with the findings in the report. No results found.

## 2023-01-20 NOTE — Assessment & Plan Note (Addendum)
Borderline splenomegaly, new since January 2023.  Possibly reactive.  May be secondary to underlying autoimmune disease.  Pending rheumatology workup. Labs are reviewed and discussed with patient.  No significant immunophenotypic abnormality on peripheral blood flow cytometry, no M protein on multiple myeloma panel, normal  light chain ratio,negative  HIV, normal LDH, negative EBV DNA by PCR, negaive CMV DNA by PCR,  I recommend observation.

## 2023-01-20 NOTE — Assessment & Plan Note (Signed)
ANA 1:640 Refer to rheumatology 

## 2023-01-20 NOTE — Assessment & Plan Note (Signed)
Labs reviewed and discussed with patient. Patient has improved hemoglobin and iron panel. No need for IV Venofer treatments.

## 2023-01-25 ENCOUNTER — Observation Stay
Admission: EM | Admit: 2023-01-25 | Discharge: 2023-01-26 | Disposition: A | Discharge status: Home / Self Care | Payer: PPO | Attending: Internal Medicine | Admitting: Internal Medicine

## 2023-01-25 ENCOUNTER — Other Ambulatory Visit: Payer: Self-pay

## 2023-01-25 DIAGNOSIS — Z87891 Personal history of nicotine dependence: Secondary | ICD-10-CM | POA: Insufficient documentation

## 2023-01-25 DIAGNOSIS — I10 Essential (primary) hypertension: Secondary | ICD-10-CM | POA: Diagnosis not present

## 2023-01-25 DIAGNOSIS — R739 Hyperglycemia, unspecified: Secondary | ICD-10-CM | POA: Diagnosis not present

## 2023-01-25 DIAGNOSIS — E1169 Type 2 diabetes mellitus with other specified complication: Secondary | ICD-10-CM

## 2023-01-25 DIAGNOSIS — Z8673 Personal history of transient ischemic attack (TIA), and cerebral infarction without residual deficits: Secondary | ICD-10-CM | POA: Diagnosis not present

## 2023-01-25 DIAGNOSIS — E876 Hypokalemia: Secondary | ICD-10-CM | POA: Insufficient documentation

## 2023-01-25 DIAGNOSIS — E11649 Type 2 diabetes mellitus with hypoglycemia without coma: Secondary | ICD-10-CM | POA: Diagnosis not present

## 2023-01-25 DIAGNOSIS — Z7982 Long term (current) use of aspirin: Secondary | ICD-10-CM | POA: Insufficient documentation

## 2023-01-25 DIAGNOSIS — Z79899 Other long term (current) drug therapy: Secondary | ICD-10-CM | POA: Insufficient documentation

## 2023-01-25 DIAGNOSIS — R9431 Abnormal electrocardiogram [ECG] [EKG]: Secondary | ICD-10-CM | POA: Diagnosis not present

## 2023-01-25 DIAGNOSIS — F39 Unspecified mood [affective] disorder: Secondary | ICD-10-CM | POA: Insufficient documentation

## 2023-01-25 DIAGNOSIS — K219 Gastro-esophageal reflux disease without esophagitis: Secondary | ICD-10-CM | POA: Insufficient documentation

## 2023-01-25 DIAGNOSIS — E162 Hypoglycemia, unspecified: Secondary | ICD-10-CM | POA: Diagnosis present

## 2023-01-25 LAB — COMPREHENSIVE METABOLIC PANEL
ALT: 16 U/L (ref 0–44)
AST: 27 U/L (ref 15–41)
Albumin: 4.1 g/dL (ref 3.5–5.0)
Alkaline Phosphatase: 57 U/L (ref 38–126)
Anion gap: 10 (ref 5–15)
BUN: 13 mg/dL (ref 8–23)
CO2: 25 mmol/L (ref 22–32)
Calcium: 9.2 mg/dL (ref 8.9–10.3)
Chloride: 105 mmol/L (ref 98–111)
Creatinine, Ser: 0.85 mg/dL (ref 0.44–1.00)
GFR, Estimated: >60 mL/min (ref >60)
Glucose, Bld: 115 mg/dL — ABNORMAL HIGH (ref 70–99)
Potassium: 2.6 mmol/L — CL (ref 3.5–5.1)
Sodium: 140 mmol/L (ref 135–145)
Total Bilirubin: 0.4 mg/dL (ref 0.3–1.2)
Total Protein: 7.7 g/dL (ref 6.5–8.1)

## 2023-01-25 LAB — URINALYSIS, ROUTINE W REFLEX MICROSCOPIC
Bilirubin Urine: NEGATIVE
Glucose, UA: NEGATIVE mg/dL
Ketones, ur: NEGATIVE mg/dL
Nitrite: NEGATIVE
Protein, ur: NEGATIVE mg/dL
Specific Gravity, Urine: 1.02 (ref 1.005–1.030)
pH: 5 (ref 5.0–8.0)

## 2023-01-25 LAB — CBC WITH DIFFERENTIAL/PLATELET
Abs Immature Granulocytes: 0.02 10*3/uL (ref 0.00–0.07)
Basophils Absolute: 0 10*3/uL (ref 0.0–0.1)
Basophils Relative: 0 %
Eosinophils Absolute: 0 10*3/uL (ref 0.0–0.5)
Eosinophils Relative: 0 %
HCT: 40 % (ref 36.0–46.0)
Hemoglobin: 13.4 g/dL (ref 12.0–15.0)
Immature Granulocytes: 0 %
Lymphocytes Relative: 16 %
Lymphs Abs: 0.8 10*3/uL (ref 0.7–4.0)
MCH: 27.6 pg (ref 26.0–34.0)
MCHC: 33.5 g/dL (ref 30.0–36.0)
MCV: 82.3 fL (ref 80.0–100.0)
Monocytes Absolute: 0.3 10*3/uL (ref 0.1–1.0)
Monocytes Relative: 5 %
Neutro Abs: 4 10*3/uL (ref 1.7–7.7)
Neutrophils Relative %: 79 %
Platelets: 219 10*3/uL (ref 150–400)
RBC: 4.86 MIL/uL (ref 3.87–5.11)
RDW: 14.3 % (ref 11.5–15.5)
WBC: 5.1 10*3/uL (ref 4.0–10.5)
nRBC: 0 % (ref 0.0–0.2)

## 2023-01-25 LAB — CBG MONITORING, ED
Glucose-Capillary: 113 mg/dL — ABNORMAL HIGH (ref 70–99)
Glucose-Capillary: 117 mg/dL — ABNORMAL HIGH (ref 70–99)
Glucose-Capillary: 15 mg/dL — CL (ref 70–99)

## 2023-01-25 MED ORDER — DEXTROSE 50 % IV SOLN
1.0000 | Freq: Once | INTRAVENOUS | Status: AC
  Administered 2023-01-25: 50 mL via INTRAVENOUS
  Filled 2023-01-25: qty 50

## 2023-01-25 MED ORDER — POTASSIUM CHLORIDE 10 MEQ/100ML IV SOLN
10.0000 meq | Freq: Once | INTRAVENOUS | Status: AC
  Administered 2023-01-25: 10 meq via INTRAVENOUS
  Filled 2023-01-25: qty 100

## 2023-01-25 NOTE — ED Notes (Signed)
Patient given warm blanket per request. 

## 2023-01-25 NOTE — ED Triage Notes (Signed)
Pt comes in from home via [facility] with complaints of hypoglycemia. According to ems, they were called out because the patient was slurring her words. When they arrived the patient's initial blood sugar was 42, after receiving 100 mL of D10, it went up to 165, pt's blood sugar was down to 126 on arrival. Pt hasn't eaten today, stating that she hasn't had an appetite. Pt states that she took her "sugar pill" around 11:30 today. Pt is alert and oriented x4. Pt has a 20g in the left antecubital.

## 2023-01-25 NOTE — ED Notes (Signed)
Patient yelling out, requesting a ginger ale.  Patient educated on the call bell.  Patient verbalized understanding.

## 2023-01-25 NOTE — ED Notes (Signed)
It was noted that patient removed all monitoring equipment and was acting "strange."  Patient alert and oriented, but saying things that didn't make any sense and laughing.  CBG checked and results as noted.  D50 given per [facility] and patient given graham crackers and peanut butter and a ginger ale.

## 2023-01-25 NOTE — ED Provider Notes (Signed)
[facility] Provider Note    Event Date/Time   First MD Initiated Contact with Patient 01/25/23 1725     (approximate)   History   Hypoglycemia   HPI  Lydia Foster is a 62 y.o. female with a history of diabetes on glimepiride and iron deficiency who presents with hyperglycemia.  EMS was called out as the patient was noted to be slurring her words.  They checked her glucose and it was 42.  The patient received dextrose and went up to 165.  That time the patient's symptoms resolved.  The patient reports that she took her diabetes medication around 1130 this morning.  She states that she has been getting over a GI infection over the last few days and has not been eating and drinking much.  However she has not had any vomiting or diarrhea today.  She denies any active abdominal pain.  I reviewed the past medical records.  The patient's most recent outpatient encounter was with hematology on 4/26 for follow-up of borderline splenomegaly, iron deficiency, and elevated ANA level.  She has no recent hospitalizations.   Physical Exam   Triage Vital Signs: ED Triage Vitals  Enc Vitals Group     BP 01/25/23 1736 (!) 141/73     Pulse Rate 01/25/23 1736 75     Resp 01/25/23 1736 17     Temp 01/25/23 1736 98 F (36.7 C)     Temp Source 01/25/23 1736 Oral     SpO2 01/25/23 1736 99 %     Weight --      Height --      Head Circumference --      Peak Flow --      Pain Score 01/25/23 1715 0     Pain Loc --      Pain Edu? --      Excl. in GC? --     Most recent vital signs: Vitals:   01/25/23 2250 01/25/23 2300  BP: (!) 167/64 (!) 156/62  Pulse: 64 69  Resp: 16 16  Temp:    SpO2: 99% 98%     General: Awake, no distress.  CV:  Good peripheral perfusion.  Resp:  Normal effort.  Abd:  No distention.  Other:  No significant peripheral edema.  No jaundice or scleral icterus.   ED Results / Procedures / Treatments   Labs (all labs ordered are listed,  but only abnormal results are displayed) Labs Reviewed  COMPREHENSIVE METABOLIC PANEL - Abnormal; Notable for the following components:      Result Value   Potassium 2.6 (*)    Glucose, Bld 115 (*)    All other components within normal limits  URINALYSIS, ROUTINE W REFLEX MICROSCOPIC - Abnormal; Notable for the following components:   Color, Urine YELLOW (*)    APPearance HAZY (*)    Hgb urine dipstick SMALL (*)    Leukocytes,Ua MODERATE (*)    Bacteria, UA RARE (*)    All other components within normal limits  CBG MONITORING, ED - Abnormal; Notable for the following components:   Glucose-Capillary 113 (*)    All other components within normal limits  CBG MONITORING, ED - Abnormal; Notable for the following components:   Glucose-Capillary 15 (*)    All other components within normal limits  CBG MONITORING, ED - Abnormal; Notable for the following components:   Glucose-Capillary 117 (*)    All other components within normal limits  CBC WITH DIFFERENTIAL/PLATELET  EKG  ED ECG REPORT I, Dionne Bucy, the attending physician, personally viewed and interpreted this ECG.  Date: 01/25/2023 EKG Time: 1733 Rate: 76 Rhythm: normal sinus rhythm QRS Axis: normal Intervals: Prolonged QTc ST/T Wave abnormalities: Nonspecific T wave abnormalities Narrative Interpretation: no evidence of acute ischemia; no prior EKG available for comparison    RADIOLOGY   PROCEDURES:  Critical Care performed: No  Procedures   MEDICATIONS ORDERED IN ED: Medications  potassium chloride 10 mEq in 100 mL IVPB (0 mEq Intravenous Stopped 01/25/23 2001)  potassium chloride 10 mEq in 100 mL IVPB (0 mEq Intravenous Stopped 01/25/23 2143)  dextrose 50 % solution 50 mL (50 mLs Intravenous Given 01/25/23 2159)     IMPRESSION / MDM / ASSESSMENT AND PLAN / ED COURSE  I reviewed the triage vital signs and the nursing notes.  62 year old female with PMH as noted above presents with an episode of  slurred speech and weakness and was found to be hypoglycemic.  Once dextrose was given the symptoms resolved.  The patient is currently asymptomatic.  Physical exam is unremarkable.  Glucose remained stable in the 100s here.  Differential diagnosis includes, but is not limited to, hypoglycemia due to medication.  Overall I suspect that the patient became hypoglycemic after taking her glimepiride this morning and then not eating.  However she has no active GI symptoms at this time.  Will obtain lab workup, urinalysis, and observe the patient for few hours to make sure she does not have recurrent hypoglycemia.  Patient's presentation is most consistent with acute presentation with potential threat to life or bodily function.  The patient is on the cardiac monitor to evaluate for evidence of arrhythmia and/or significant heart rate changes.   ----------------------------------------- 11:48 PM on 01/25/2023 -----------------------------------------  Lab workup revealed hypokalemia to 2.6.  I ordered IV potassium repletion.  Electrolytes are unremarkable.  There is no leukocytosis or anemia.  The patient had a recurrent episode of altered mental status and was found to have a glucose of 15.  She was given D50.  Given the recurrent hypoglycemia she will need admission for further monitoring.  I consulted the hospitalist; based on our discussion she agreed to evaluate the patient for admission.    FINAL CLINICAL IMPRESSION(S) / ED DIAGNOSES   Final diagnoses:  Hypoglycemia     Rx / DC Orders   ED Discharge Orders     None        Note:  This document was prepared using Dragon voice recognition software and may include unintentional dictation errors.    Dionne Bucy, MD 01/25/23 [phone]

## 2023-01-26 ENCOUNTER — Encounter: Payer: Self-pay | Admitting: Family Medicine

## 2023-01-26 DIAGNOSIS — R9431 Abnormal electrocardiogram [ECG] [EKG]: Secondary | ICD-10-CM | POA: Insufficient documentation

## 2023-01-26 DIAGNOSIS — E1169 Type 2 diabetes mellitus with other specified complication: Secondary | ICD-10-CM

## 2023-01-26 DIAGNOSIS — I10 Essential (primary) hypertension: Secondary | ICD-10-CM | POA: Insufficient documentation

## 2023-01-26 DIAGNOSIS — E876 Hypokalemia: Secondary | ICD-10-CM

## 2023-01-26 DIAGNOSIS — K219 Gastro-esophageal reflux disease without esophagitis: Secondary | ICD-10-CM | POA: Insufficient documentation

## 2023-01-26 DIAGNOSIS — E162 Hypoglycemia, unspecified: Secondary | ICD-10-CM | POA: Diagnosis not present

## 2023-01-26 DIAGNOSIS — R739 Hyperglycemia, unspecified: Secondary | ICD-10-CM | POA: Diagnosis not present

## 2023-01-26 DIAGNOSIS — F39 Unspecified mood [affective] disorder: Secondary | ICD-10-CM | POA: Insufficient documentation

## 2023-01-26 LAB — CBC WITH DIFFERENTIAL/PLATELET
Abs Immature Granulocytes: 0.01 10*3/uL (ref 0.00–0.07)
Basophils Absolute: 0 10*3/uL (ref 0.0–0.1)
Basophils Relative: 0 %
Eosinophils Absolute: 0.1 10*3/uL (ref 0.0–0.5)
Eosinophils Relative: 1 %
HCT: 38.6 % (ref 36.0–46.0)
Hemoglobin: 12.9 g/dL (ref 12.0–15.0)
Immature Granulocytes: 0 %
Lymphocytes Relative: 32 %
Lymphs Abs: 1.5 10*3/uL (ref 0.7–4.0)
MCH: 27.8 pg (ref 26.0–34.0)
MCHC: 33.4 g/dL (ref 30.0–36.0)
MCV: 83.2 fL (ref 80.0–100.0)
Monocytes Absolute: 0.3 10*3/uL (ref 0.1–1.0)
Monocytes Relative: 7 %
Neutro Abs: 2.8 10*3/uL (ref 1.7–7.7)
Neutrophils Relative %: 60 %
Platelets: 225 10*3/uL (ref 150–400)
RBC: 4.64 MIL/uL (ref 3.87–5.11)
RDW: 14.5 % (ref 11.5–15.5)
WBC: 4.7 10*3/uL (ref 4.0–10.5)
nRBC: 0 % (ref 0.0–0.2)

## 2023-01-26 LAB — CBG MONITORING, ED: Glucose-Capillary: 162 mg/dL — ABNORMAL HIGH (ref 70–99)

## 2023-01-26 LAB — COMPREHENSIVE METABOLIC PANEL
ALT: 15 U/L (ref 0–44)
AST: 28 U/L (ref 15–41)
Albumin: 3.7 g/dL (ref 3.5–5.0)
Alkaline Phosphatase: 50 U/L (ref 38–126)
Anion gap: 10 (ref 5–15)
BUN: 10 mg/dL (ref 8–23)
CO2: 25 mmol/L (ref 22–32)
Calcium: 9.1 mg/dL (ref 8.9–10.3)
Chloride: 106 mmol/L (ref 98–111)
Creatinine, Ser: 0.81 mg/dL (ref 0.44–1.00)
GFR, Estimated: >60 mL/min (ref >60)
Glucose, Bld: 138 mg/dL — ABNORMAL HIGH (ref 70–99)
Potassium: 3.2 mmol/L — ABNORMAL LOW (ref 3.5–5.1)
Sodium: 141 mmol/L (ref 135–145)
Total Bilirubin: 0.5 mg/dL (ref 0.3–1.2)
Total Protein: 6.8 g/dL (ref 6.5–8.1)

## 2023-01-26 LAB — GLUCOSE, CAPILLARY
Glucose-Capillary: 140 mg/dL — ABNORMAL HIGH (ref 70–99)
Glucose-Capillary: 150 mg/dL — ABNORMAL HIGH (ref 70–99)
Glucose-Capillary: 103 mg/dL — ABNORMAL HIGH (ref 70–99)
Glucose-Capillary: 104 mg/dL — ABNORMAL HIGH (ref 70–99)
Glucose-Capillary: 156 mg/dL — ABNORMAL HIGH (ref 70–99)

## 2023-01-26 LAB — MAGNESIUM: Magnesium: 1.8 mg/dL (ref 1.7–2.4)

## 2023-01-26 MED ORDER — FENOFIBRATE 54 MG PO TABS
54.0000 mg | ORAL_TABLET | Freq: Every day | ORAL | Status: DC
  Administered 2023-01-26: 54 mg via ORAL
  Filled 2023-01-26: qty 1

## 2023-01-26 MED ORDER — TOPIRAMATE 25 MG PO TABS
25.0000 mg | ORAL_TABLET | Freq: Every day | ORAL | Status: DC
  Administered 2023-01-26: 25 mg via ORAL
  Filled 2023-01-26: qty 1

## 2023-01-26 MED ORDER — PANTOPRAZOLE SODIUM 40 MG PO TBEC
40.0000 mg | DELAYED_RELEASE_TABLET | Freq: Two times a day (BID) | ORAL | Status: DC
  Administered 2023-01-26 (×2): 40 mg via ORAL
  Filled 2023-01-26 (×2): qty 1

## 2023-01-26 MED ORDER — QUETIAPINE FUMARATE 25 MG PO TABS
50.0000 mg | ORAL_TABLET | Freq: Every day | ORAL | Status: DC
  Administered 2023-01-26: 50 mg via ORAL
  Filled 2023-01-26: qty 2

## 2023-01-26 MED ORDER — ACETAMINOPHEN 650 MG RE SUPP: 650.0000 mg | Freq: Four times a day (QID) | RECTAL | Status: DC | PRN

## 2023-01-26 MED ORDER — MORPHINE SULFATE (PF) 2 MG/ML IV SOLN: 2.0000 mg | INTRAVENOUS | Status: DC | PRN

## 2023-01-26 MED ORDER — SERTRALINE HCL 50 MG PO TABS
100.0000 mg | ORAL_TABLET | Freq: Every morning | ORAL | Status: DC
  Administered 2023-01-26: 100 mg via ORAL
  Filled 2023-01-26: qty 2

## 2023-01-26 MED ORDER — FERROUS SULFATE 325 (65 FE) MG PO TABS
325.0000 mg | ORAL_TABLET | Freq: Every day | ORAL | Status: DC
  Administered 2023-01-26: 325 mg via ORAL
  Filled 2023-01-26: qty 1

## 2023-01-26 MED ORDER — LISINOPRIL 10 MG PO TABS
10.0000 mg | ORAL_TABLET | Freq: Every day | ORAL | Status: DC
  Administered 2023-01-26: 10 mg via ORAL
  Filled 2023-01-26: qty 1

## 2023-01-26 MED ORDER — ASPIRIN 81 MG PO TBEC
81.0000 mg | DELAYED_RELEASE_TABLET | Freq: Every day | ORAL | Status: DC
  Administered 2023-01-26: 81 mg via ORAL
  Filled 2023-01-26: qty 1

## 2023-01-26 MED ORDER — ONDANSETRON HCL 4 MG PO TABS: 4.0000 mg | ORAL_TABLET | Freq: Four times a day (QID) | ORAL | Status: DC | PRN

## 2023-01-26 MED ORDER — HEPARIN SODIUM (PORCINE) 5000 UNIT/ML IJ SOLN
5000.0000 [IU] | Freq: Three times a day (TID) | INTRAMUSCULAR | Status: DC
  Administered 2023-01-26 (×2): 5000 [IU] via SUBCUTANEOUS
  Filled 2023-01-26 (×2): qty 1

## 2023-01-26 MED ORDER — ACETAMINOPHEN 325 MG PO TABS: 650.0000 mg | ORAL_TABLET | Freq: Four times a day (QID) | ORAL | Status: DC | PRN

## 2023-01-26 MED ORDER — POTASSIUM CHLORIDE 10 MEQ/100ML IV SOLN
10.0000 meq | INTRAVENOUS | Status: AC
  Administered 2023-01-26 (×4): 10 meq via INTRAVENOUS
  Filled 2023-01-26 (×2): qty 100

## 2023-01-26 MED ORDER — SUCRALFATE 1 G PO TABS
1.0000 g | ORAL_TABLET | Freq: Four times a day (QID) | ORAL | Status: DC
  Administered 2023-01-26: 1 g via ORAL
  Filled 2023-01-26: qty 1

## 2023-01-26 MED ORDER — OXYCODONE HCL 5 MG PO TABS: 5.0000 mg | ORAL_TABLET | ORAL | Status: DC | PRN

## 2023-01-26 MED ORDER — VENLAFAXINE HCL ER 75 MG PO CP24
75.0000 mg | ORAL_CAPSULE | Freq: Every morning | ORAL | Status: DC
  Administered 2023-01-26: 75 mg via ORAL
  Filled 2023-01-26: qty 1

## 2023-01-26 MED ORDER — MESALAMINE 1.2 G PO TBEC
1.2000 g | DELAYED_RELEASE_TABLET | Freq: Every day | ORAL | Status: DC
  Administered 2023-01-26: 1.2 g via ORAL
  Filled 2023-01-26: qty 1

## 2023-01-26 MED ORDER — FAMOTIDINE 20 MG PO TABS
20.0000 mg | ORAL_TABLET | Freq: Every day | ORAL | Status: DC
  Administered 2023-01-26: 20 mg via ORAL
  Filled 2023-01-26: qty 1

## 2023-01-26 MED ORDER — VITAMIN B-12 1000 MCG PO TABS
1000.0000 ug | ORAL_TABLET | Freq: Every day | ORAL | Status: DC
  Administered 2023-01-26: 1000 ug via ORAL
  Filled 2023-01-26: qty 1

## 2023-01-26 MED ORDER — ONDANSETRON HCL 4 MG/2ML IJ SOLN: 4.0000 mg | Freq: Four times a day (QID) | INTRAMUSCULAR | Status: DC | PRN

## 2023-01-26 NOTE — Assessment & Plan Note (Signed)
-  K+ 2.6 - in the ED - at admission -Continue to monitor

## 2023-01-26 NOTE — Assessment & Plan Note (Signed)
Continue pepcid

## 2023-01-26 NOTE — Assessment & Plan Note (Signed)
Continue lisinopril

## 2023-01-26 NOTE — Progress Notes (Signed)
ED called to make pt aware that she will be in a shared room. There are no private rooms available at this time.

## 2023-01-26 NOTE — Discharge Summary (Signed)
Physician Discharge Summary  Lydia Foster ZOX:[id number] DOB: July 20, 1961 DOA: 01/25/2023  PCP: Marguarite Arbour, MD  Admit date: 01/25/2023 Discharge date: 01/26/2023  Admitted From: home  Disposition:  home   Recommendations for Outpatient Follow-up:  Follow up with PCP in 1-2 weeks  Home Health: no Equipment/Devices:  Discharge Condition: stable  CODE STATUS: full  Diet recommendation: Carb Modified   Brief/Interim Summary: HPI was taken from Dr. Carren Rang: Lydia Foster is a 62 y.o. female with medical history significant of anxiety, depression, DMII, legally blind, history of stroke, and more presents to the ED  with a chief complaint of hypoglycemia. Patient has not eaten in several days. She reports that at first, she was having diarrhea, and doing self-prescribed bowel rest. She continued not eating after the diarrhea had resolved, and she reports that it was because her grandson was sick with n/v. Likely depression was playing a role here, her daughter died the Mar 05, 2023 before mother's day 3 years ago, and we are fast approaching that anniversary. Anyway, she has not eaten, but has continued to take her glimepiride. She had some slurred speech and her family thought she was having a stroke so they called EMS. Patient reports when EMS got there her glucose was 42. They gave her some PO glucose and her sugar improved, but they rec'd that she come to the ED. In the ED her glucose dropped down to 15. She was given an amp of D50. She was confused when her glucose was that low, but her mentation improved with correction of glucose. She reports that her glucose never gets that low at home, in fact it usually runs high. She had never felt a hypoglycemic episode before. She reports that she remembers being confused and shaky, but does not remember being diaphoretic. Patient reports no pain. She denies n/v/d in a couple of days. She did not have LOC. Patient has no other complaints at this time.     Patient does not drink, does not smoke, and does not use illicit drugs. She is vaxed for covid. Patient is DNR - she reports that she has the paperwork at home, but has not finished filling it out yet.    As per Dr. Mayford Knife 01/26/23: Pt evidently took her glimepiride and did not eat any food. Pt stated she wasn't really hungry but still took her glimepiride. No more hypoglycemic episodes were present on the day of d/c and pt was tolerating a po diet prior to d/c. It was recommended for pt not take glimepiride if she was not going to eat/didn't feel like eating as this medication can often cause hypoglycemic episodes. Pt verbalized her understanding.   Discharge Diagnoses:  Principal Problem:   Hypoglycemia Active Problems:   Hypokalemia   Prolonged QT interval   HTN (hypertension)   Mood disorder (HCC)   GERD (gastroesophageal reflux disease)  Hypoglycemia: likely secondary glimepiride use. Continue w/ blood sugar checks. Encourage po diet. Resolved.   DM2: likely poorly controlled. Holding glimepiride secondary to hypoglycemic episodes.    GERD: continue on pepcid    Mood disorder: unknown severity. Continue on home dose of zoloft, effexor    HTN: continue on lisinopril    Hypokalemia: potassium given   Discharge Instructions  Discharge Instructions     Diet Carb Modified   Complete by: As directed    Discharge instructions   Complete by: As directed    F/u w/ PCP in 1-2 weeks   Increase activity slowly   Complete  by: As directed       Allergies as of 01/26/2023       Reactions   Codeine Other (See Comments)   Hydrocodone-acetaminophen Other (See Comments)   Sulfa Antibiotics Other (See Comments)   Tioconazole Other (See Comments)   worses symptoms   Ibuprofen Rash        Medication List     STOP taking these medications    sucralfate 1 g tablet Commonly known as: Carafate       TAKE these medications    ascorbic acid 500 MG tablet Commonly known  as: VITAMIN C Take 500 mg by mouth daily.   aspirin EC 81 MG tablet Take 81 mg by mouth daily. Swallow whole.   cholecalciferol 25 MCG (1000 UNIT) tablet Commonly known as: VITAMIN D3 Take 1,000 Units by mouth daily.   cyanocobalamin 1000 MCG tablet Commonly known as: VITAMIN B12 Take 1,000 mcg by mouth daily.   famotidine 20 MG tablet Commonly known as: Pepcid Take 1 tablet (20 mg total) by mouth daily.   fenofibrate 48 MG tablet Commonly known as: TRICOR Take 48 mg by mouth daily.   ferrous sulfate 325 (65 FE) MG EC tablet Take 325 mg by mouth daily with breakfast.   glimepiride 4 MG tablet Commonly known as: AMARYL Take 4 mg by mouth 2 (two) times daily.   lisinopril 10 MG tablet Commonly known as: ZESTRIL Take 10 mg by mouth daily.   mesalamine 1.2 g EC tablet Commonly known as: LIALDA Take 1.2 g by mouth in the morning and at bedtime.   multivitamin tablet Take 1 tablet by mouth daily.   mupirocin ointment 2 % Commonly known as: BACTROBAN Place 1 application into the nose 3 (three) times daily.   nystatin cream Commonly known as: MYCOSTATIN Apply 1 application topically 2 (two) times daily.   pantoprazole 40 MG tablet Commonly known as: PROTONIX Take 40 mg by mouth 2 (two) times daily.   QUEtiapine 50 MG tablet Commonly known as: SEROQUEL Take 50 mg by mouth at bedtime.   sertraline 100 MG tablet Commonly known as: ZOLOFT Take 100 mg by mouth every morning.   topiramate 25 MG tablet Commonly known as: TOPAMAX Take 25 mg by mouth at bedtime.   venlafaxine XR 75 MG 24 hr capsule Commonly known as: EFFEXOR-XR Take 75 mg by mouth every morning.   vitamin A 40981 UNIT capsule Take 25,000 Units by mouth daily.   vitamin E 180 MG (400 UNITS) capsule Take 400 Units by mouth daily.        Allergies  Allergen Reactions   Codeine Other (See Comments)   Hydrocodone-Acetaminophen Other (See Comments)   Sulfa Antibiotics Other (See Comments)    Tioconazole Other (See Comments)    worses symptoms   Ibuprofen Rash    Consultations:    Procedures/Studies: No results found. (Echo, Carotid, EGD, Colonoscopy, ERCP)    Subjective: Pt denies any complaints    Discharge Exam: Vitals:   01/26/23 0235 01/26/23 0754  BP: (!) 168/72 138/69  Pulse: 71 72  Resp: 20 18  Temp: 97.9 F (36.6 C) 98.1 F (36.7 C)  SpO2: 99% 100%   Vitals:   01/26/23 0100 01/26/23 0200 01/26/23 0235 01/26/23 0754  BP: (!) 148/79 (!) 137/55 (!) 168/72 138/69  Pulse: 69 74 71 72  Resp: 19 17 20 18   Temp:  98.3 F (36.8 C) 97.9 F (36.6 C) 98.1 F (36.7 C)  TempSrc:  Oral Oral  SpO2: 97% 98% 99% 100%    General: Pt is alert, awake, not in acute distress Cardiovascular: S1/S2 +, no rubs, no gallops Respiratory: CTA bilaterally, no wheezing, no rhonchi Abdominal: Soft, NT, ND, bowel sounds + Extremities: no edema, no cyanosis    The results of significant diagnostics from this hospitalization (including imaging, microbiology, ancillary and laboratory) are listed below for reference.     Microbiology: No results found for this or any previous visit (from the past 240 hour(s)).   Labs: BNP (last 3 results) No results for input(s): "BNP" in the last 8760 hours. Basic Metabolic Panel: Recent Labs  Lab 01/25/23 1744 01/26/23 0434  NA 140 141  K 2.6* 3.2*  CL 105 106  CO2 25 25  GLUCOSE 115* 138*  BUN 13 10  CREATININE 0.85 0.81  CALCIUM 9.2 9.1  MG  --  1.8   Liver Function Tests: Recent Labs  Lab 01/25/23 1744 01/26/23 0434  AST 27 28  ALT 16 15  ALKPHOS 57 50  BILITOT 0.4 0.5  PROT 7.7 6.8  ALBUMIN 4.1 3.7   No results for input(s): "LIPASE", "AMYLASE" in the last 168 hours. No results for input(s): "AMMONIA" in the last 168 hours. CBC: Recent Labs  Lab 01/25/23 1744 01/26/23 0434  WBC 5.1 4.7  NEUTROABS 4.0 2.8  HGB 13.4 12.9  HCT 40.0 38.6  MCV 82.3 83.2  PLT 219 225   Cardiac Enzymes: No results for  input(s): "CKTOTAL", "CKMB", "CKMBINDEX", "TROPONINI" in the last 168 hours. BNP: Invalid input(s): "POCBNP" CBG: Recent Labs  Lab 01/26/23 0233 01/26/23 0351 01/26/23 0616 01/26/23 0756 01/26/23 1200  GLUCAP 140* 150* 103* 104* 156*   D-Dimer No results for input(s): "DDIMER" in the last 72 hours. Hgb A1c No results for input(s): "HGBA1C" in the last 72 hours. Lipid Profile No results for input(s): "CHOL", "HDL", "LDLCALC", "TRIG", "CHOLHDL", "LDLDIRECT" in the last 72 hours. Thyroid function studies No results for input(s): "TSH", "T4TOTAL", "T3FREE", "THYROIDAB" in the last 72 hours.  Invalid input(s): "FREET3" Anemia work up No results for input(s): "VITAMINB12", "FOLATE", "FERRITIN", "TIBC", "IRON", "RETICCTPCT" in the last 72 hours. Urinalysis    Component Value Date/Time   COLORURINE YELLOW (A) 01/25/2023 1744   APPEARANCEUR HAZY (A) 01/25/2023 1744   LABSPEC 1.020 01/25/2023 1744   PHURINE 5.0 01/25/2023 1744   GLUCOSEU NEGATIVE 01/25/2023 1744   HGBUR SMALL (A) 01/25/2023 1744   BILIRUBINUR NEGATIVE 01/25/2023 1744   KETONESUR NEGATIVE 01/25/2023 1744   PROTEINUR NEGATIVE 01/25/2023 1744   NITRITE NEGATIVE 01/25/2023 1744   LEUKOCYTESUR MODERATE (A) 01/25/2023 1744   Sepsis Labs Recent Labs  Lab 01/25/23 1744 01/26/23 0434  WBC 5.1 4.7   Microbiology No results found for this or any previous visit (from the past 240 hour(s)).   Time coordinating discharge: Over 30 minutes  SIGNED:   Charise Killian, MD  [facility] 01/26/2023, 12:10 PM Pager   If 7PM-7AM, please contact night-coverage [email or web address]

## 2023-01-26 NOTE — Progress Notes (Signed)
Evaluated patient at bedside for admission. She has declined the admission stating that she has an appt tomorrow that she has to be at. I have discussed the risks of her going home and her glucose dropping again, which could include death. Patient reports that she has someone at home who can help her get PO intake if her glucose drops again. I have discussed all of this with the ED doc who states that he will talk to her as well. At this time, patient is declining admission.

## 2023-01-26 NOTE — Assessment & Plan Note (Signed)
-  Holding glimepiride -Check glucose q2 -Has been recorded as low as 15 in the ED -Encourage PO intake -Heart healthy diet -Continue to monitor

## 2023-01-26 NOTE — Assessment & Plan Note (Signed)
-  Likely 2/2 to hypokalemia -monitor on tele -Replace, and recheck K+ in the AM -Recheck EKG in the AM - if QT is still prolonged, SSRI will meed to be held

## 2023-01-26 NOTE — Assessment & Plan Note (Signed)
-  Continue zoloft, seroquel, Effexor

## 2023-01-26 NOTE — H&P (Signed)
History and Physical    Patient: Lydia Foster:[id number] DOB: [birth year] DOA: 01/25/2023 DOS: the patient was seen and examined on 01/26/2023 PCP: Marguarite Arbour, MD  Patient coming from: home  Chief Complaint:  Chief Complaint  Patient presents with   Hypoglycemia   HPI: Lydia Foster is a 62 y.o. female with medical history significant of anxiety, depression, DMII, legally blind, history of stroke, and more presents to the ED  with a chief complaint of hypoglycemia. Patient has not eaten in several days. She reports that at first, she was having diarrhea, and doing self-prescribed bowel rest. She continued not eating after the diarrhea had resolved, and she reports that it was because her grandson was sick with n/v. Likely depression was playing a role here, her daughter died the 2023-02-15 before mother's day 3 years ago, and we are fast approaching that anniversary. Anyway, she has not eaten, but has continued to take her glimepiride. She had some slurred speech and her family thought she was having a stroke so they called EMS. Patient reports when EMS got there her glucose was 42. They gave her some PO glucose and her sugar improved, but they rec'd that she come to the ED. In the ED her glucose dropped down to 15. She was given an amp of D50. She was confused when her glucose was that low, but her mentation improved with correction of glucose. She reports that her glucose never gets that low at home, in fact it usually runs high. She had never felt a hypoglycemic episode before. She reports that she remembers being confused and shaky, but does not remember being diaphoretic. Patient reports no pain. She denies n/v/d in a couple of days. She did not have LOC. Patient has no other complaints at this time.   Patient does not drink, does not smoke, and does not use illicit drugs. She is vaxed for covid. Patient is DNR - she reports that she has the paperwork at home, but has not finished filling it  out yet.  Review of Systems: As mentioned in the history of present illness. All other systems reviewed and are negative. Past Medical History:  Diagnosis Date   Anxiety    Colon polyps    Depression    Diabetes mellitus without complication (HCC)    Legally blind    Low iron    Recurrent strokes (HCC)    Spleen enlarged    Past Surgical History:  Procedure Laterality Date   carpel tunnel     COLONOSCOPY     COLONOSCOPY WITH PROPOFOL N/A 04/16/2018   Procedure: COLONOSCOPY WITH PROPOFOL;  Surgeon: Scot Jun, MD;  Location: [facility] ENDOSCOPY;  Service: Endoscopy;  Laterality: N/A;   COLONOSCOPY WITH PROPOFOL N/A 10/10/2022   Procedure: COLONOSCOPY WITH PROPOFOL;  Surgeon: Regis Bill, MD;  Location: [facility] ENDOSCOPY;  Service: Endoscopy;  Laterality: N/A;   ESOPHAGOGASTRODUODENOSCOPY (EGD) WITH PROPOFOL N/A 10/10/2022   Procedure: ESOPHAGOGASTRODUODENOSCOPY (EGD) WITH PROPOFOL;  Surgeon: Regis Bill, MD;  Location: [facility] ENDOSCOPY;  Service: Endoscopy;  Laterality: N/A;   TUBAL LIGATION     Social History:  reports that she has quit smoking. Her smoking use included cigarettes. She has never used smokeless tobacco. She reports that she does not drink alcohol and does not use drugs.  Allergies  Allergen Reactions   Codeine Other (See Comments)   Hydrocodone-Acetaminophen Other (See Comments)   Sulfa Antibiotics Other (See Comments)   Tioconazole Other (See Comments)    worses  symptoms   Ibuprofen Rash    Family History  Problem Relation Age of Onset   Stroke Mother    Hypertension Mother    Diabetes Mother    Diabetes Father    Diabetes Brother    Breast cancer Maternal Aunt    Stroke Paternal Aunt    Stroke Maternal Grandmother    Diabetes Maternal Grandmother    Diabetes Paternal Grandmother     Prior to Admission medications   Medication Sig Start Date End Date Taking? Authorizing Provider  aspirin EC 81 MG tablet Take 81 mg by mouth daily. Swallow  whole.    [provider]  Beta Carotene (VITAMIN A) 25000 UNIT capsule Take 25,000 Units by mouth daily.    [provider]  cholecalciferol (VITAMIN D3) 25 MCG (1000 UNIT) tablet Take 1,000 Units by mouth daily.    [provider]  cyanocobalamin (VITAMIN B12) 1000 MCG tablet Take 1,000 mcg by mouth daily.    [provider]  famotidine (PEPCID) 20 MG tablet Take 1 tablet (20 mg total) by mouth daily. 06/26/22 06/26/23  Phineas Semen, MD  fenofibrate (TRICOR) 48 MG tablet Take 48 mg by mouth daily. 02/18/22   [provider]  ferrous sulfate 325 (65 FE) MG EC tablet Take 325 mg by mouth daily with breakfast.    [provider]  glimepiride (AMARYL) 4 MG tablet Take 4 mg by mouth 2 (two) times daily.    [provider]  lisinopril (PRINIVIL,ZESTRIL) 10 MG tablet Take 10 mg by mouth daily.    [provider]  mesalamine (LIALDA) 1.2 g EC tablet Take 1.2 g by mouth in the morning and at bedtime.    [provider]  Multiple Vitamin (MULTIVITAMIN) tablet Take 1 tablet by mouth daily.    [provider]  mupirocin ointment (BACTROBAN) 2 % Place 1 application into the nose 3 (three) times daily.    [provider]  nystatin cream (MYCOSTATIN) Apply 1 application topically 2 (two) times daily.    [provider]  pantoprazole (PROTONIX) 40 MG tablet Take 40 mg by mouth 2 (two) times daily.    [provider]  QUEtiapine (SEROQUEL) 50 MG tablet Take 50 mg by mouth at bedtime. 02/18/22   [provider]  sertraline (ZOLOFT) 100 MG tablet Take 100 mg by mouth every morning.    [provider]  sucralfate (CARAFATE) 1 g tablet Take 1 tablet (1 g total) by mouth 4 (four) times daily. Patient not taking: Reported on 01/20/2023 06/26/22 06/26/23  Phineas Semen, MD  topiramate (TOPAMAX) 25 MG tablet Take 25 mg by mouth at bedtime. 02/18/22   [provider]  venlafaxine  XR (EFFEXOR-XR) 75 MG 24 hr capsule Take 75 mg by mouth every morning.    [provider]  vitamin C (ASCORBIC ACID) 500 MG tablet Take 500 mg by mouth daily.    [provider]  vitamin E 400 UNIT capsule Take 400 Units by mouth daily.    [provider]    Physical Exam: Vitals:   01/25/23 2100 01/25/23 2130 01/25/23 2250 01/25/23 2300  BP: (!) 134/59 (!) 144/86 (!) 167/64 (!) 156/62  Pulse: 64 63 64 69  Resp: 14 16 16 16   Temp:  97.9 F (36.6 C)    TempSrc:      SpO2: 94% 98% 99% 98%   1.  General: Patient lying supine in bed,  no acute distress   2. Psychiatric: Alert  and oriented x 3, mood and behavior normal for situation, pleasant and cooperative with exam   3. Neurologic: Speech and language are normal, face is symmetric, moves all 4 extremities voluntarily, at baseline without acute deficits on limited exam   4. HEENMT:  Head is atraumatic, normocephalic, pupils reactive to light, neck is supple, trachea is midline, mucous membranes are moist   5. Respiratory : Lungs are clear to auscultation bilaterally without wheezing, rhonchi, rales, no cyanosis, no increase in work of breathing or accessory muscle use   6. Cardiovascular : Heart rate normal, rhythm is regular, no murmurs, rubs or gallops, no peripheral edema, peripheral pulses palpated   7. Gastrointestinal:  Abdomen is soft, nondistended, nontender to palpation bowel sounds active, no masses or organomegaly palpated   8. Skin:  Skin is warm, dry and intact without rashes, acute lesions, or ulcers on limited exam   9.Musculoskeletal:  No acute deformities or trauma, no asymmetry in tone, no peripheral edema, peripheral pulses palpated, no tenderness to palpation in the extremities  Data Reviewed: In the ED Patient was afebrile, with heart rate 63-80, respiratory rate 14-21, blood pressure 134/59-177/86 No leukocytosis, hemoglobin stable 13.4 Chemistry reveals a hypokalemia at 2.6  and on chemistry her glucose was 115 CBGs ranged from 15-107. UA is borderline, but patient denies any urinary symptoms EKG shows a heart rate of 76 with sinus rhythm and a QTc of 555 Admission requested for further management of recurrent hypoglycemia secondary to glimepiride  Assessment and Plan: * Hypoglycemia -Holding glimepiride -Check glucose q2 -Has been recorded as low as 15 in the ED -Encourage PO intake -Heart healthy diet -Continue to monitor   GERD (gastroesophageal reflux disease) -Continue pepcid  Mood disorder (HCC) -Continue zoloft, seroquel, Effexor  HTN (hypertension) Continue lisinopril   Hypokalemia -K+ 2.6 - in the ED - at admission -Continue to monitor      Advance Care Planning:   Code Status: DNR   Consults: none at this time  Family Communication: no family at bedside  Severity of Illness: The appropriate patient status for this patient is OBSERVATION. Observation status is judged to be reasonable and necessary in order to provide the required intensity of service to ensure the patient's safety. The patient's presenting symptoms, physical exam findings, and initial radiographic and laboratory data in the context of their medical condition is felt to place them at decreased risk for further clinical deterioration. Furthermore, it is anticipated that the patient will be medically stable for discharge from the hospital within 2 midnights of admission.   Author: Lilyan Gilford, DO 01/26/2023 1:04 AM  For on call review [email or web address].

## 2023-02-06 DIAGNOSIS — E1165 Type 2 diabetes mellitus with hyperglycemia: Secondary | ICD-10-CM | POA: Diagnosis not present

## 2023-02-06 DIAGNOSIS — E162 Hypoglycemia, unspecified: Secondary | ICD-10-CM | POA: Diagnosis not present

## 2023-02-06 DIAGNOSIS — Z79899 Other long term (current) drug therapy: Secondary | ICD-10-CM | POA: Diagnosis not present

## 2023-02-06 DIAGNOSIS — E876 Hypokalemia: Secondary | ICD-10-CM | POA: Diagnosis not present

## 2023-02-21 DIAGNOSIS — R768 Other specified abnormal immunological findings in serum: Secondary | ICD-10-CM | POA: Diagnosis not present

## 2023-03-07 DIAGNOSIS — F330 Major depressive disorder, recurrent, mild: Secondary | ICD-10-CM | POA: Diagnosis not present

## 2023-03-07 DIAGNOSIS — F419 Anxiety disorder, unspecified: Secondary | ICD-10-CM | POA: Diagnosis not present

## 2023-03-07 DIAGNOSIS — F5105 Insomnia due to other mental disorder: Secondary | ICD-10-CM | POA: Diagnosis not present

## 2023-03-14 DIAGNOSIS — F331 Major depressive disorder, recurrent, moderate: Secondary | ICD-10-CM | POA: Diagnosis not present

## 2023-03-14 DIAGNOSIS — I10 Essential (primary) hypertension: Secondary | ICD-10-CM | POA: Diagnosis not present

## 2023-03-14 DIAGNOSIS — E782 Mixed hyperlipidemia: Secondary | ICD-10-CM | POA: Diagnosis not present

## 2023-03-14 DIAGNOSIS — E1165 Type 2 diabetes mellitus with hyperglycemia: Secondary | ICD-10-CM | POA: Diagnosis not present

## 2023-03-14 DIAGNOSIS — Z79899 Other long term (current) drug therapy: Secondary | ICD-10-CM | POA: Diagnosis not present

## 2023-04-05 ENCOUNTER — Ambulatory Visit
Admission: RE | Admit: 2023-04-05 | Discharge: 2023-04-05 | Disposition: A | Discharge status: Home / Self Care | Payer: PPO | Source: Ambulatory Visit | Attending: Certified Nurse Midwife | Admitting: Certified Nurse Midwife

## 2023-04-05 DIAGNOSIS — Z1231 Encounter for screening mammogram for malignant neoplasm of breast: Secondary | ICD-10-CM | POA: Diagnosis not present

## 2023-04-06 DIAGNOSIS — F330 Major depressive disorder, recurrent, mild: Secondary | ICD-10-CM | POA: Diagnosis not present

## 2023-04-06 DIAGNOSIS — F419 Anxiety disorder, unspecified: Secondary | ICD-10-CM | POA: Diagnosis not present

## 2023-04-06 DIAGNOSIS — F5105 Insomnia due to other mental disorder: Secondary | ICD-10-CM | POA: Diagnosis not present

## 2023-04-17 DIAGNOSIS — R899 Unspecified abnormal finding in specimens from other organs, systems and tissues: Secondary | ICD-10-CM | POA: Diagnosis not present

## 2023-05-18 DIAGNOSIS — Z79899 Other long term (current) drug therapy: Secondary | ICD-10-CM | POA: Diagnosis not present

## 2023-06-15 DIAGNOSIS — E876 Hypokalemia: Secondary | ICD-10-CM | POA: Diagnosis not present

## 2023-06-15 DIAGNOSIS — E1165 Type 2 diabetes mellitus with hyperglycemia: Secondary | ICD-10-CM | POA: Diagnosis not present

## 2023-06-15 DIAGNOSIS — I10 Essential (primary) hypertension: Secondary | ICD-10-CM | POA: Diagnosis not present

## 2023-06-15 DIAGNOSIS — E782 Mixed hyperlipidemia: Secondary | ICD-10-CM | POA: Diagnosis not present

## 2023-06-15 DIAGNOSIS — Z79899 Other long term (current) drug therapy: Secondary | ICD-10-CM | POA: Diagnosis not present

## 2023-06-15 DIAGNOSIS — F331 Major depressive disorder, recurrent, moderate: Secondary | ICD-10-CM | POA: Diagnosis not present

## 2023-06-15 DIAGNOSIS — N1831 Chronic kidney disease, stage 3a: Secondary | ICD-10-CM | POA: Diagnosis not present

## 2023-07-04 DIAGNOSIS — E1122 Type 2 diabetes mellitus with diabetic chronic kidney disease: Secondary | ICD-10-CM | POA: Diagnosis not present

## 2023-07-04 DIAGNOSIS — N1831 Chronic kidney disease, stage 3a: Secondary | ICD-10-CM | POA: Diagnosis not present

## 2023-07-04 DIAGNOSIS — E1165 Type 2 diabetes mellitus with hyperglycemia: Secondary | ICD-10-CM | POA: Diagnosis not present

## 2023-07-04 DIAGNOSIS — E782 Mixed hyperlipidemia: Secondary | ICD-10-CM | POA: Diagnosis not present

## 2023-07-04 DIAGNOSIS — I10 Essential (primary) hypertension: Secondary | ICD-10-CM | POA: Diagnosis not present

## 2023-07-04 DIAGNOSIS — E11649 Type 2 diabetes mellitus with hypoglycemia without coma: Secondary | ICD-10-CM | POA: Diagnosis not present

## 2023-07-06 DIAGNOSIS — F330 Major depressive disorder, recurrent, mild: Secondary | ICD-10-CM | POA: Diagnosis not present

## 2023-07-06 DIAGNOSIS — F419 Anxiety disorder, unspecified: Secondary | ICD-10-CM | POA: Diagnosis not present

## 2023-07-06 DIAGNOSIS — F5105 Insomnia due to other mental disorder: Secondary | ICD-10-CM | POA: Diagnosis not present

## 2023-07-24 DIAGNOSIS — E119 Type 2 diabetes mellitus without complications: Secondary | ICD-10-CM | POA: Diagnosis not present

## 2023-07-24 DIAGNOSIS — H269 Unspecified cataract: Secondary | ICD-10-CM | POA: Diagnosis not present

## 2023-07-24 DIAGNOSIS — H3552 Pigmentary retinal dystrophy: Secondary | ICD-10-CM | POA: Diagnosis not present

## 2023-09-14 DIAGNOSIS — E1122 Type 2 diabetes mellitus with diabetic chronic kidney disease: Secondary | ICD-10-CM | POA: Diagnosis not present

## 2023-09-14 DIAGNOSIS — N1831 Chronic kidney disease, stage 3a: Secondary | ICD-10-CM | POA: Diagnosis not present

## 2023-09-14 DIAGNOSIS — I10 Essential (primary) hypertension: Secondary | ICD-10-CM | POA: Diagnosis not present

## 2023-09-14 DIAGNOSIS — E11649 Type 2 diabetes mellitus with hypoglycemia without coma: Secondary | ICD-10-CM | POA: Diagnosis not present

## 2023-09-14 DIAGNOSIS — E782 Mixed hyperlipidemia: Secondary | ICD-10-CM | POA: Diagnosis not present

## 2023-09-19 DIAGNOSIS — E1065 Type 1 diabetes mellitus with hyperglycemia: Secondary | ICD-10-CM | POA: Diagnosis not present

## 2023-09-29 DIAGNOSIS — K5190 Ulcerative colitis, unspecified, without complications: Secondary | ICD-10-CM | POA: Diagnosis not present

## 2023-09-29 DIAGNOSIS — I10 Essential (primary) hypertension: Secondary | ICD-10-CM | POA: Diagnosis not present

## 2023-09-29 DIAGNOSIS — Z79899 Other long term (current) drug therapy: Secondary | ICD-10-CM | POA: Diagnosis not present

## 2023-09-29 DIAGNOSIS — F331 Major depressive disorder, recurrent, moderate: Secondary | ICD-10-CM | POA: Diagnosis not present

## 2023-09-29 DIAGNOSIS — E782 Mixed hyperlipidemia: Secondary | ICD-10-CM | POA: Diagnosis not present

## 2023-09-29 DIAGNOSIS — E1122 Type 2 diabetes mellitus with diabetic chronic kidney disease: Secondary | ICD-10-CM | POA: Diagnosis not present

## 2023-09-29 DIAGNOSIS — N1831 Chronic kidney disease, stage 3a: Secondary | ICD-10-CM | POA: Diagnosis not present

## 2023-09-29 DIAGNOSIS — E1165 Type 2 diabetes mellitus with hyperglycemia: Secondary | ICD-10-CM | POA: Diagnosis not present

## 2023-09-29 DIAGNOSIS — Z0000 Encounter for general adult medical examination without abnormal findings: Secondary | ICD-10-CM | POA: Diagnosis not present

## 2023-10-05 DIAGNOSIS — F330 Major depressive disorder, recurrent, mild: Secondary | ICD-10-CM | POA: Diagnosis not present

## 2023-10-05 DIAGNOSIS — F419 Anxiety disorder, unspecified: Secondary | ICD-10-CM | POA: Diagnosis not present

## 2023-10-05 DIAGNOSIS — F5105 Insomnia due to other mental disorder: Secondary | ICD-10-CM | POA: Diagnosis not present

## 2023-10-19 DIAGNOSIS — E11649 Type 2 diabetes mellitus with hypoglycemia without coma: Secondary | ICD-10-CM | POA: Diagnosis not present

## 2023-10-20 DIAGNOSIS — E1065 Type 1 diabetes mellitus with hyperglycemia: Secondary | ICD-10-CM | POA: Diagnosis not present

## 2023-11-20 DIAGNOSIS — E1065 Type 1 diabetes mellitus with hyperglycemia: Secondary | ICD-10-CM | POA: Diagnosis not present

## 2023-12-18 DIAGNOSIS — E1065 Type 1 diabetes mellitus with hyperglycemia: Secondary | ICD-10-CM | POA: Diagnosis not present

## 2023-12-18 DIAGNOSIS — E1122 Type 2 diabetes mellitus with diabetic chronic kidney disease: Secondary | ICD-10-CM | POA: Diagnosis not present

## 2023-12-18 DIAGNOSIS — I10 Essential (primary) hypertension: Secondary | ICD-10-CM | POA: Diagnosis not present

## 2023-12-18 DIAGNOSIS — E11649 Type 2 diabetes mellitus with hypoglycemia without coma: Secondary | ICD-10-CM | POA: Diagnosis not present

## 2023-12-18 DIAGNOSIS — E782 Mixed hyperlipidemia: Secondary | ICD-10-CM | POA: Diagnosis not present

## 2023-12-18 DIAGNOSIS — N1831 Chronic kidney disease, stage 3a: Secondary | ICD-10-CM | POA: Diagnosis not present

## 2024-01-02 DIAGNOSIS — F419 Anxiety disorder, unspecified: Secondary | ICD-10-CM | POA: Diagnosis not present

## 2024-01-02 DIAGNOSIS — F330 Major depressive disorder, recurrent, mild: Secondary | ICD-10-CM | POA: Diagnosis not present

## 2024-01-02 DIAGNOSIS — F5105 Insomnia due to other mental disorder: Secondary | ICD-10-CM | POA: Diagnosis not present

## 2024-01-18 DIAGNOSIS — E1065 Type 1 diabetes mellitus with hyperglycemia: Secondary | ICD-10-CM | POA: Diagnosis not present

## 2024-01-29 DIAGNOSIS — E782 Mixed hyperlipidemia: Secondary | ICD-10-CM | POA: Diagnosis not present

## 2024-01-29 DIAGNOSIS — F3341 Major depressive disorder, recurrent, in partial remission: Secondary | ICD-10-CM | POA: Diagnosis not present

## 2024-01-29 DIAGNOSIS — Z79899 Other long term (current) drug therapy: Secondary | ICD-10-CM | POA: Diagnosis not present

## 2024-01-29 DIAGNOSIS — I10 Essential (primary) hypertension: Secondary | ICD-10-CM | POA: Diagnosis not present

## 2024-01-29 DIAGNOSIS — Z1231 Encounter for screening mammogram for malignant neoplasm of breast: Secondary | ICD-10-CM | POA: Diagnosis not present

## 2024-01-29 DIAGNOSIS — E1165 Type 2 diabetes mellitus with hyperglycemia: Secondary | ICD-10-CM | POA: Diagnosis not present

## 2024-02-01 ENCOUNTER — Other Ambulatory Visit: Payer: Self-pay | Admitting: Internal Medicine

## 2024-02-01 DIAGNOSIS — Z1231 Encounter for screening mammogram for malignant neoplasm of breast: Secondary | ICD-10-CM

## 2024-02-17 DIAGNOSIS — E1065 Type 1 diabetes mellitus with hyperglycemia: Secondary | ICD-10-CM | POA: Diagnosis not present

## 2024-03-19 DIAGNOSIS — E1065 Type 1 diabetes mellitus with hyperglycemia: Secondary | ICD-10-CM | POA: Diagnosis not present

## 2024-04-02 DIAGNOSIS — F419 Anxiety disorder, unspecified: Secondary | ICD-10-CM | POA: Diagnosis not present

## 2024-04-02 DIAGNOSIS — F330 Major depressive disorder, recurrent, mild: Secondary | ICD-10-CM | POA: Diagnosis not present

## 2024-04-02 DIAGNOSIS — F5105 Insomnia due to other mental disorder: Secondary | ICD-10-CM | POA: Diagnosis not present

## 2024-04-04 DIAGNOSIS — I10 Essential (primary) hypertension: Secondary | ICD-10-CM | POA: Diagnosis not present

## 2024-04-04 DIAGNOSIS — Z79899 Other long term (current) drug therapy: Secondary | ICD-10-CM | POA: Diagnosis not present

## 2024-04-04 DIAGNOSIS — E1165 Type 2 diabetes mellitus with hyperglycemia: Secondary | ICD-10-CM | POA: Diagnosis not present

## 2024-04-04 DIAGNOSIS — E782 Mixed hyperlipidemia: Secondary | ICD-10-CM | POA: Diagnosis not present

## 2024-04-18 DIAGNOSIS — E1065 Type 1 diabetes mellitus with hyperglycemia: Secondary | ICD-10-CM | POA: Diagnosis not present

## 2024-04-19 ENCOUNTER — Ambulatory Visit
Admission: RE | Admit: 2024-04-19 | Discharge: 2024-04-19 | Disposition: A | Discharge status: Home / Self Care | Source: Ambulatory Visit | Attending: Internal Medicine | Admitting: Internal Medicine

## 2024-04-19 DIAGNOSIS — Z1231 Encounter for screening mammogram for malignant neoplasm of breast: Secondary | ICD-10-CM | POA: Diagnosis not present

## 2024-04-23 DIAGNOSIS — E1122 Type 2 diabetes mellitus with diabetic chronic kidney disease: Secondary | ICD-10-CM | POA: Diagnosis not present

## 2024-04-23 DIAGNOSIS — E11649 Type 2 diabetes mellitus with hypoglycemia without coma: Secondary | ICD-10-CM | POA: Diagnosis not present

## 2024-04-23 DIAGNOSIS — E782 Mixed hyperlipidemia: Secondary | ICD-10-CM | POA: Diagnosis not present

## 2024-04-23 DIAGNOSIS — N1831 Chronic kidney disease, stage 3a: Secondary | ICD-10-CM | POA: Diagnosis not present

## 2024-04-23 DIAGNOSIS — I10 Essential (primary) hypertension: Secondary | ICD-10-CM | POA: Diagnosis not present

## 2024-04-23 DIAGNOSIS — E1165 Type 2 diabetes mellitus with hyperglycemia: Secondary | ICD-10-CM | POA: Diagnosis not present

## 2024-05-19 DIAGNOSIS — E1065 Type 1 diabetes mellitus with hyperglycemia: Secondary | ICD-10-CM | POA: Diagnosis not present

## 2024-05-31 DIAGNOSIS — E1165 Type 2 diabetes mellitus with hyperglycemia: Secondary | ICD-10-CM | POA: Diagnosis not present

## 2024-05-31 DIAGNOSIS — I10 Essential (primary) hypertension: Secondary | ICD-10-CM | POA: Diagnosis not present

## 2024-05-31 DIAGNOSIS — E782 Mixed hyperlipidemia: Secondary | ICD-10-CM | POA: Diagnosis not present

## 2024-05-31 DIAGNOSIS — Z79899 Other long term (current) drug therapy: Secondary | ICD-10-CM | POA: Diagnosis not present

## 2024-05-31 DIAGNOSIS — F3341 Major depressive disorder, recurrent, in partial remission: Secondary | ICD-10-CM | POA: Diagnosis not present

## 2024-06-19 DIAGNOSIS — E1065 Type 1 diabetes mellitus with hyperglycemia: Secondary | ICD-10-CM | POA: Diagnosis not present

## 2024-06-27 DIAGNOSIS — F419 Anxiety disorder, unspecified: Secondary | ICD-10-CM | POA: Diagnosis not present

## 2024-06-27 DIAGNOSIS — F5105 Insomnia due to other mental disorder: Secondary | ICD-10-CM | POA: Diagnosis not present

## 2024-06-27 DIAGNOSIS — F330 Major depressive disorder, recurrent, mild: Secondary | ICD-10-CM | POA: Diagnosis not present

## 2024-07-25 DIAGNOSIS — H35372 Puckering of macula, left eye: Secondary | ICD-10-CM | POA: Diagnosis not present

## 2024-07-25 DIAGNOSIS — E119 Type 2 diabetes mellitus without complications: Secondary | ICD-10-CM | POA: Diagnosis not present

## 2024-07-25 DIAGNOSIS — H3552 Pigmentary retinal dystrophy: Secondary | ICD-10-CM | POA: Diagnosis not present

## 2024-07-25 DIAGNOSIS — H269 Unspecified cataract: Secondary | ICD-10-CM | POA: Diagnosis not present

## 2024-07-26 DIAGNOSIS — E1122 Type 2 diabetes mellitus with diabetic chronic kidney disease: Secondary | ICD-10-CM | POA: Diagnosis not present

## 2024-07-26 DIAGNOSIS — Z79899 Other long term (current) drug therapy: Secondary | ICD-10-CM | POA: Diagnosis not present

## 2024-07-26 DIAGNOSIS — E1165 Type 2 diabetes mellitus with hyperglycemia: Secondary | ICD-10-CM | POA: Diagnosis not present

## 2024-07-26 DIAGNOSIS — I10 Essential (primary) hypertension: Secondary | ICD-10-CM | POA: Diagnosis not present

## 2024-07-26 DIAGNOSIS — N1831 Chronic kidney disease, stage 3a: Secondary | ICD-10-CM | POA: Diagnosis not present

## 2024-07-26 DIAGNOSIS — E11649 Type 2 diabetes mellitus with hypoglycemia without coma: Secondary | ICD-10-CM | POA: Diagnosis not present

## 2024-07-26 DIAGNOSIS — E782 Mixed hyperlipidemia: Secondary | ICD-10-CM | POA: Diagnosis not present
